# Patient Record
Sex: Male | Born: 1945 | Race: Black or African American | Hispanic: No | Smoking: Current every day smoker
Health system: Southern US, Community
[De-identification: ages and names within clinical notes are randomized; demographics above are authoritative.]

## PROBLEM LIST (undated history)

## (undated) DIAGNOSIS — K219 Gastro-esophageal reflux disease without esophagitis: Secondary | ICD-10-CM

## (undated) DIAGNOSIS — R0600 Dyspnea, unspecified: Secondary | ICD-10-CM

## (undated) DIAGNOSIS — Z973 Presence of spectacles and contact lenses: Secondary | ICD-10-CM

## (undated) DIAGNOSIS — F32A Depression, unspecified: Secondary | ICD-10-CM

## (undated) DIAGNOSIS — F17200 Nicotine dependence, unspecified, uncomplicated: Secondary | ICD-10-CM

## (undated) DIAGNOSIS — M1990 Unspecified osteoarthritis, unspecified site: Secondary | ICD-10-CM

## (undated) DIAGNOSIS — C61 Malignant neoplasm of prostate: Secondary | ICD-10-CM

## (undated) DIAGNOSIS — F419 Anxiety disorder, unspecified: Secondary | ICD-10-CM

## (undated) DIAGNOSIS — Z86010 Personal history of colonic polyps: Secondary | ICD-10-CM

## (undated) DIAGNOSIS — R350 Frequency of micturition: Secondary | ICD-10-CM

## (undated) DIAGNOSIS — I10 Essential (primary) hypertension: Secondary | ICD-10-CM

## (undated) DIAGNOSIS — F329 Major depressive disorder, single episode, unspecified: Secondary | ICD-10-CM

## (undated) HISTORY — PX: INGUINAL HERNIA REPAIR: SUR1180

## (undated) HISTORY — DX: Nicotine dependence, unspecified, uncomplicated: F17.200

## (undated) HISTORY — DX: Anxiety disorder, unspecified: F41.9

## (undated) HISTORY — PX: TONSILLECTOMY: SUR1361

## (undated) HISTORY — PX: EXCISIONAL HEMORRHOIDECTOMY: SHX1541

## (undated) HISTORY — DX: Gastro-esophageal reflux disease without esophagitis: K21.9

## (undated) HISTORY — DX: Personal history of colonic polyps: Z86.010

## (undated) HISTORY — DX: Essential (primary) hypertension: I10

## (undated) HISTORY — DX: Unspecified osteoarthritis, unspecified site: M19.90

---

## 1998-03-20 ENCOUNTER — Emergency Department (HOSPITAL_COMMUNITY): Admission: EM | Admit: 1998-03-20 | Discharge: 1998-03-20 | Payer: Self-pay | Admitting: Emergency Medicine

## 1998-03-20 ENCOUNTER — Encounter: Payer: Self-pay | Admitting: Emergency Medicine

## 1998-06-08 ENCOUNTER — Emergency Department (HOSPITAL_COMMUNITY): Admission: EM | Admit: 1998-06-08 | Discharge: 1998-06-08 | Payer: Self-pay | Admitting: Emergency Medicine

## 2001-08-31 ENCOUNTER — Encounter (HOSPITAL_BASED_OUTPATIENT_CLINIC_OR_DEPARTMENT_OTHER): Payer: Self-pay | Admitting: General Surgery

## 2001-09-05 ENCOUNTER — Ambulatory Visit (HOSPITAL_COMMUNITY): Admission: RE | Admit: 2001-09-05 | Discharge: 2001-09-05 | Payer: Self-pay | Admitting: General Surgery

## 2002-01-07 ENCOUNTER — Emergency Department (HOSPITAL_COMMUNITY): Admission: EM | Admit: 2002-01-07 | Discharge: 2002-01-07 | Payer: Self-pay | Admitting: *Deleted

## 2002-02-04 DIAGNOSIS — Z860100 Personal history of colon polyps, unspecified: Secondary | ICD-10-CM

## 2002-02-04 HISTORY — DX: Personal history of colon polyps, unspecified: Z86.0100

## 2002-02-04 HISTORY — DX: Personal history of colonic polyps: Z86.010

## 2002-07-09 ENCOUNTER — Ambulatory Visit (HOSPITAL_COMMUNITY): Admission: RE | Admit: 2002-07-09 | Discharge: 2002-07-09 | Payer: Self-pay | Admitting: General Surgery

## 2010-10-27 ENCOUNTER — Emergency Department (HOSPITAL_COMMUNITY)
Admission: EM | Admit: 2010-10-27 | Discharge: 2010-10-27 | Disposition: A | Discharge status: Home / Self Care | Payer: Medicare Other | Attending: Emergency Medicine | Admitting: Emergency Medicine

## 2010-10-27 DIAGNOSIS — I10 Essential (primary) hypertension: Secondary | ICD-10-CM | POA: Insufficient documentation

## 2010-10-27 DIAGNOSIS — R5381 Other malaise: Secondary | ICD-10-CM | POA: Insufficient documentation

## 2010-10-27 DIAGNOSIS — R6883 Chills (without fever): Secondary | ICD-10-CM | POA: Insufficient documentation

## 2010-10-27 DIAGNOSIS — K047 Periapical abscess without sinus: Secondary | ICD-10-CM | POA: Insufficient documentation

## 2010-10-27 LAB — POCT I-STAT, CHEM 8
BUN: 21 mg/dL (ref 6–23)
Calcium, Ion: 1.36 mmol/L — ABNORMAL HIGH (ref 1.12–1.32)
Chloride: 101 mEq/L (ref 96–112)
Creatinine, Ser: 1 mg/dL (ref 0.50–1.35)
Glucose, Bld: 127 mg/dL — ABNORMAL HIGH (ref 70–99)
HCT: 52 % (ref 39.0–52.0)
Hemoglobin: 17.7 g/dL — ABNORMAL HIGH (ref 13.0–17.0)
Potassium: 4.2 mEq/L (ref 3.5–5.1)
Sodium: 135 mEq/L (ref 135–145)
TCO2: 25 mmol/L (ref 0–100)

## 2010-10-27 LAB — DIFFERENTIAL
Basophils Absolute: 0 10*3/uL (ref 0.0–0.1)
Basophils Relative: 0 % (ref 0–1)
Eosinophils Absolute: 0 10*3/uL (ref 0.0–0.7)
Eosinophils Relative: 0 % (ref 0–5)
Lymphocytes Relative: 13 % (ref 12–46)
Lymphs Abs: 1.3 10*3/uL (ref 0.7–4.0)
Monocytes Absolute: 1.4 10*3/uL — ABNORMAL HIGH (ref 0.1–1.0)
Monocytes Relative: 14 % — ABNORMAL HIGH (ref 3–12)
Neutro Abs: 7 10*3/uL (ref 1.7–7.7)
Neutrophils Relative %: 72 % (ref 43–77)

## 2010-10-27 LAB — CBC
HCT: 47 % (ref 39.0–52.0)
Hemoglobin: 15.5 g/dL (ref 13.0–17.0)
MCH: 30 pg (ref 26.0–34.0)
MCHC: 33 g/dL (ref 30.0–36.0)
MCV: 90.9 fL (ref 78.0–100.0)
Platelets: 204 10*3/uL (ref 150–400)
RBC: 5.17 MIL/uL (ref 4.22–5.81)
RDW: 12.6 % (ref 11.5–15.5)
WBC: 9.6 10*3/uL (ref 4.0–10.5)

## 2010-12-14 ENCOUNTER — Encounter: Payer: Self-pay | Admitting: Gastroenterology

## 2010-12-31 ENCOUNTER — Encounter: Payer: Self-pay | Admitting: Gastroenterology

## 2011-01-04 ENCOUNTER — Ambulatory Visit: Payer: BC Managed Care – PPO | Admitting: Gastroenterology

## 2011-01-13 ENCOUNTER — Encounter: Payer: Self-pay | Admitting: *Deleted

## 2011-01-18 ENCOUNTER — Encounter: Payer: Self-pay | Admitting: Gastroenterology

## 2011-01-18 ENCOUNTER — Ambulatory Visit (INDEPENDENT_AMBULATORY_CARE_PROVIDER_SITE_OTHER): Payer: Medicare Other | Admitting: Gastroenterology

## 2011-01-18 DIAGNOSIS — R1013 Epigastric pain: Secondary | ICD-10-CM

## 2011-01-18 DIAGNOSIS — Z860100 Personal history of colon polyps, unspecified: Secondary | ICD-10-CM | POA: Insufficient documentation

## 2011-01-18 DIAGNOSIS — F32A Depression, unspecified: Secondary | ICD-10-CM | POA: Insufficient documentation

## 2011-01-18 DIAGNOSIS — K219 Gastro-esophageal reflux disease without esophagitis: Secondary | ICD-10-CM | POA: Insufficient documentation

## 2011-01-18 DIAGNOSIS — F329 Major depressive disorder, single episode, unspecified: Secondary | ICD-10-CM | POA: Insufficient documentation

## 2011-01-18 MED ORDER — PEG-KCL-NACL-NASULF-NA ASC-C 100 G PO SOLR: 1.0000 | Freq: Once | ORAL | Status: DC

## 2011-01-18 MED ORDER — ESOMEPRAZOLE MAGNESIUM 40 MG PO CPDR: 40.0000 mg | DELAYED_RELEASE_CAPSULE | Freq: Every day | ORAL | Status: DC

## 2011-01-18 NOTE — Patient Instructions (Addendum)
You have been given a separate informational sheet regarding your tobacco use, the importance of quitting and local resources to help you quit. Your procedure has been scheduled for 02/02/2011, please follow the seperate instructions.  Propofol sedation handout given.

## 2011-01-18 NOTE — Progress Notes (Signed)
History of Present Illness:  This is a very pleasant 65 year old African American male patient of Dr. Tracey Harries deferred for screening colonoscopy. The patient had a negative colonoscopy one year ago. He currently denies rectal bleeding, melena, abdominal pain, anorexia or weight loss. He does have worsening acid reflux over the last year, with both daytime and nighttime regurgitation and burning, but no dysphagia. He uses when necessary antacids with minimal improvement. He has not had previous endoscopic exam. His appetite is good and his weight is stable, and he denies any specific food intolerances. There is no history of known hepatitis, pancreatitis, or liver disease. He denies abuse of NSAIDs or alcohol but does smoke a half a pack of cigarettes per day.  I have reviewed this patient's present history, medical and surgical past history, allergies and medications.     ROS: The remainder of the 10 point ROS is negative... no cardiopulmonary complaints area and recent CBC a metabolic profile and urinalysis were normal.     Physical Exam: General well developed well nourished patient in no acute distress, appearing his stated age Eyes PERRLA, no icterus, fundoscopic exam per opthamologist Skin no lesions noted Neck supple, no adenopathy, no thyroid enlargement, no tenderness Chest clear to percussion and auscultation Heart no significant murmurs, gallops or rubs noted Abdomen no hepatosplenomegaly masses or tenderness, BS normal. . Extremities no acute joint lesions, edema, phlebitis or evidence of cellulitis. Neurologic patient oriented x 3, cranial nerves intact, no focal neurologic deficits noted. Psychological mental status normal and normal affect.  Assessment and plan: Screening colonoscopy has been scheduled at his convenience. It is of note that he does take daily Mobic, and may have a duodenal or gastric ulceration related to chronic NSAIDs use. His mild depression is well  controlled with Celexa 40 mg a day and trazodone 100 mg at bedtime. I placed him on AcipHex 20 mg a day before the evening meal pending endoscopic exam. At the time of endoscopy we will screen him for H. pylori infection. We will do his procedures with propofol sedation and nurse anesthesia attendants. Discontinue all other medications as listed and reviewed.  Encounter Diagnoses  Name Primary?  . Personal history of colonic polyps   . Esophageal reflux

## 2011-02-02 ENCOUNTER — Ambulatory Visit (AMBULATORY_SURGERY_CENTER): Payer: BC Managed Care – PPO | Admitting: Gastroenterology

## 2011-02-02 ENCOUNTER — Encounter: Payer: Self-pay | Admitting: Gastroenterology

## 2011-02-02 DIAGNOSIS — K2970 Gastritis, unspecified, without bleeding: Secondary | ICD-10-CM

## 2011-02-02 DIAGNOSIS — Z86010 Personal history of colonic polyps: Secondary | ICD-10-CM

## 2011-02-02 DIAGNOSIS — Z1211 Encounter for screening for malignant neoplasm of colon: Secondary | ICD-10-CM

## 2011-02-02 DIAGNOSIS — D128 Benign neoplasm of rectum: Secondary | ICD-10-CM

## 2011-02-02 DIAGNOSIS — D129 Benign neoplasm of anus and anal canal: Secondary | ICD-10-CM

## 2011-02-02 DIAGNOSIS — R1013 Epigastric pain: Secondary | ICD-10-CM

## 2011-02-02 DIAGNOSIS — K219 Gastro-esophageal reflux disease without esophagitis: Secondary | ICD-10-CM

## 2011-02-02 MED ORDER — SODIUM CHLORIDE 0.9 % IV SOLN: 500.0000 mL | INTRAVENOUS | Status: DC

## 2011-02-02 NOTE — Op Note (Signed)
[facility] [address] [place], [place]  [address]  COLONOSCOPY PROCEDURE REPORT  PATIENT:  Brady Rodriguez, Brady Rodriguez  MR#:  [id number] BIRTHDATE:  1945-05-09, 65 yrs. old  GENDER:  male ENDOSCOPIST:  Vania Rea. Jarold Motto, MD, [facility] REF. BY:  Tracey Harries, M.D. PROCEDURE DATE:  02/02/2011 PROCEDURE:  Colonoscopy with biopsy ASA CLASS:  Class II INDICATIONS:  history of polyps MEDICATIONS:   propofol (Diprivan) 200 mg IV  DESCRIPTION OF PROCEDURE:   After the risks and benefits and of the procedure were explained, informed consent was obtained. Digital rectal exam was performed and revealed no abnormalities. The LB CF-H180AL [id number] endoscope was introduced through the anus and advanced to the cecum, which was identified by both the appendix and ileocecal valve.  The quality of the prep was excellent, using MoviPrep.  The instrument was then slowly withdrawn as the colon was fully examined. <<PROCEDUREIMAGES>>  FINDINGS:  There were multiple polyps identified and removed. in the rectum. small 1-3 mm rectal nodules biopsied.  This was otherwise a normal examination of the colon.   Retroflexed views in the rectum revealed no abnormalities.    The scope was then withdrawn from the patient and the procedure completed.  COMPLICATIONS:  None ENDOSCOPIC IMPRESSION: 1) Polyps, multiple in the rectum 2) Otherwise normal examination R/O ADENOMAS. RECOMMENDATIONS: 1) Repeat colonoscopy in 5 years if polyp adenomatous; otherwise 10 years  REPEAT EXAM:  No  ______________________________ Vania Rea. Jarold Motto, MD, [facility]  CC:  n. eSIGNED:   Vania Rea. Talal Fritchman at 02/02/2011 02:31 PM  Orlean Bradford, [phone]

## 2011-02-02 NOTE — Progress Notes (Signed)
Patient did not experience any of the following events: a burn prior to discharge; a fall within the facility; wrong site/side/patient/procedure/implant event; or a hospital transfer or hospital admission upon discharge from the facility. [phone]) Patient did not have preoperative order for IV antibiotic SSI prophylaxis. [phone])   No complaints noted in the recovery room. maw

## 2011-02-02 NOTE — Patient Instructions (Signed)
See the picture page for your findings from your exam today.  Follow the green and blue discharge instruction sheets the rest of the day.  Resume your prior medications today. Please call if any questions or concerns.  

## 2011-02-03 ENCOUNTER — Telehealth: Payer: Self-pay

## 2011-02-03 DIAGNOSIS — K2970 Gastritis, unspecified, without bleeding: Secondary | ICD-10-CM

## 2011-02-03 DIAGNOSIS — K2990 Gastroduodenitis, unspecified, without bleeding: Secondary | ICD-10-CM

## 2011-02-03 LAB — HELICOBACTER PYLORI SCREEN-BIOPSY: UREASE: NEGATIVE

## 2011-02-03 NOTE — Op Note (Addendum)
[facility] [address] [place], [place]  [address]  ENDOSCOPY PROCEDURE REPORT  PATIENT:  Brady, Rodriguez  MR#:  [id number] BIRTHDATE:  1945-09-11, 65 yrs. old  GENDER:  male  ENDOSCOPIST:  Vania Rea. Jarold Motto, MD, [facility] Referred by:  Tracey Harries, M.D.  PROCEDURE DATE:  02/02/2011 PROCEDURE:  EGD with biopsy for H. pylori 19147 ASA CLASS:  Class II INDICATIONS:  GERD HX OF PUD  MEDICATIONS:   There was residual sedation effect present from prior procedure., propofol (Diprivan) 130 mg IV TOPICAL ANESTHETIC:  DESCRIPTION OF PROCEDURE:   After the risks and benefits of the procedure were explained, informed consent was obtained.  The LB GIF-H180 [id number] endoscope was introduced through the mouth and advanced to the second portion of the duodenum.  The instrument was slowly withdrawn as the mucosa was fully examined. <<PROCEDUREIMAGES>>  A hiatal hernia was found. 5 CM HIATIAL HERNIA NOTED,NO BARRETT'S OR STRICTURE.  Otherwise the examination was normal. CLO BX. DONE. other findings. "INLET POUCH" IN PROXIMAL ESOPHAGITIS. Retroflexed views revealed a hiatal hernia.    The scope was then withdrawn from the patient and the procedure completed.  COMPLICATIONS:  None  ENDOSCOPIC IMPRESSION: 1) Hiatal hernia 2) Otherwise normal examination CHRONIC NONEROSIVE GERD ON PPI RX. RECOMMENDATIONS: 1) Anti-reflux regimen to be follow 2) Await pathology results 3) Rx CLO if positive 4) continue PPI  ______________________________ Vania Rea. Jarold Motto, MD, [facility]  CC:  n. REVISED:  02/02/2011 03:18 PM eSIGNED:   Vania Rea. Patterson at 02/02/2011 03:18 PM  Orlean Bradford, [phone]

## 2011-02-03 NOTE — Telephone Encounter (Signed)

## 2011-02-04 ENCOUNTER — Encounter: Payer: Self-pay | Admitting: Gastroenterology

## 2011-02-15 HISTORY — PX: COLONOSCOPY W/ BIOPSIES AND POLYPECTOMY: SHX1376

## 2011-02-16 ENCOUNTER — Encounter: Payer: Self-pay | Admitting: Gastroenterology

## 2011-02-17 ENCOUNTER — Encounter: Payer: Self-pay | Admitting: Gastroenterology

## 2013-05-10 DIAGNOSIS — I10 Essential (primary) hypertension: Secondary | ICD-10-CM | POA: Insufficient documentation

## 2013-05-10 DIAGNOSIS — F17200 Nicotine dependence, unspecified, uncomplicated: Secondary | ICD-10-CM | POA: Insufficient documentation

## 2013-11-29 ENCOUNTER — Other Ambulatory Visit (INDEPENDENT_AMBULATORY_CARE_PROVIDER_SITE_OTHER): Payer: Self-pay | Admitting: Surgery

## 2013-12-27 ENCOUNTER — Encounter (HOSPITAL_BASED_OUTPATIENT_CLINIC_OR_DEPARTMENT_OTHER): Payer: Self-pay | Admitting: *Deleted

## 2013-12-27 NOTE — Progress Notes (Signed)
To come in for [id number]-bmet Son to come with him for surgery

## 2013-12-30 ENCOUNTER — Encounter (HOSPITAL_BASED_OUTPATIENT_CLINIC_OR_DEPARTMENT_OTHER)
Admission: RE | Admit: 2013-12-30 | Discharge: 2013-12-30 | Disposition: A | Discharge status: Home / Self Care | Payer: Medicare Other | Source: Ambulatory Visit | Attending: Surgery | Admitting: Surgery

## 2013-12-30 ENCOUNTER — Other Ambulatory Visit: Payer: Self-pay

## 2013-12-30 DIAGNOSIS — I10 Essential (primary) hypertension: Secondary | ICD-10-CM | POA: Diagnosis not present

## 2013-12-30 DIAGNOSIS — R229 Localized swelling, mass and lump, unspecified: Secondary | ICD-10-CM | POA: Diagnosis present

## 2013-12-30 DIAGNOSIS — F17210 Nicotine dependence, cigarettes, uncomplicated: Secondary | ICD-10-CM | POA: Diagnosis not present

## 2013-12-30 DIAGNOSIS — D1779 Benign lipomatous neoplasm of other sites: Secondary | ICD-10-CM | POA: Diagnosis not present

## 2013-12-30 DIAGNOSIS — K219 Gastro-esophageal reflux disease without esophagitis: Secondary | ICD-10-CM | POA: Diagnosis not present

## 2013-12-30 LAB — BASIC METABOLIC PANEL
ANION GAP: 8 (ref 5–15)
BUN: 17 mg/dL (ref 6–23)
CALCIUM: 11.3 mg/dL — AB (ref 8.4–10.5)
CO2: 29 mEq/L (ref 19–32)
Chloride: 104 mEq/L (ref 96–112)
Creatinine, Ser: 1.03 mg/dL (ref 0.50–1.35)
GFR calc Af Amer: 84 mL/min — ABNORMAL LOW (ref >90)
GFR calc non Af Amer: 72 mL/min — ABNORMAL LOW (ref >90)
Glucose, Bld: 124 mg/dL — ABNORMAL HIGH (ref 70–99)
Potassium: 4.3 mEq/L (ref 3.7–5.3)
Sodium: 141 mEq/L (ref 137–147)

## 2013-12-30 NOTE — H&P (Signed)
  Brady Rodriguez  Location: [facility] Surgery Patient #: [id number] DOB: [birth year] Divorced / Language: Undefined / Race: Undefined Male  History of Present Illness  Patient words: eval scalp.  The patient is a 68 year old male who presents with a complaint of Mass. 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 He presents with a mass on his anterior scalp. He reports that it has been present for several years it is slowly getting larger and causing some mild discomfort. He has never had drainage from the area. He is otherwise without complaints   Other Problems  Gastroesophageal Reflux Disease  Past Surgical History  Colon Polyp Removal - Open Foot Surgery Bilateral.  Diagnostic Studies History ( Colonoscopy 1-5 years ago  Allergies  No Known Drug Allergies10/16/2015  Medication History  Cialis (5MG  Tablet, Oral as needed) Active. Atenolol (50MG  Tablet, Oral daily) Active. Omeprazole (20MG  Capsule DR, Oral daily) Active. TraZODone HCl (100MG  Tablet, Oral daily) Active.  Social History  Alcohol use Occasional alcohol use. Tobacco use Current every day smoker.  Family History Heart disease in male family member before age 93  Review of Systems  General Present- Night Sweats. Not Present- Appetite Loss, Chills, Fatigue, Fever, Weight Gain and Weight Loss. Skin Present- Dryness. Not Present- Change in Wart/Mole, Hives, Jaundice, New Lesions, Non-Healing Wounds, Rash and Ulcer. HEENT Present- Seasonal Allergies and Wears glasses/contact lenses. Not Present- Earache, Hearing Loss, Hoarseness, Nose Bleed, Oral Ulcers, Ringing in the Ears, Sinus Pain, Sore Throat, Visual Disturbances and Yellow Eyes. Respiratory Not Present- Bloody sputum, Chronic Cough, Difficulty Breathing, Snoring and Wheezing. Gastrointestinal Not Present- Abdominal Pain, Bloating, Bloody Stool, Change in Bowel Habits, Chronic diarrhea, Constipation, Difficulty Swallowing, Excessive gas, Gets full  quickly at meals, Hemorrhoids, Indigestion, Nausea, Rectal Pain and Vomiting. Male Genitourinary Present- Nocturia. Not Present- Blood in Urine, Change in Urinary Stream, Frequency, Impotence, Painful Urination, Urgency and Urine Leakage. Neurological Present- Headaches and Numbness. Not Present- Decreased Memory, Fainting, Seizures, Tingling, Tremor, Trouble walking and Weakness. Psychiatric Present- Anxiety and Depression. Not Present- Bipolar, Change in Sleep Pattern, Fearful and Frequent crying. Endocrine Not Present- Cold Intolerance, Excessive Hunger, Hair Changes, Heat Intolerance, Hot flashes and New Diabetes. Hematology Not Present- Easy Bruising, Excessive bleeding, Gland problems, HIV and Persistent Infections.   Vitals  11/29/2013 10:54 AM Weight: 165 lb Temp.: 16F(Temporal)  Pulse: 75 (Regular)  BP: 130/70 (Sitting, Left Arm, Standard)    Physical Exam  The physical exam findings are as follows: Note:On exam, he is well in appearance  There is a 2 cm scalp mass which is slightly superficial and soft. It is just off to the midline. 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 This may either represent a sebaceous cyst or lipoma. Nonetheless, it is symptomatic and needs to be removed for histologic evaluation. I discussed this with him in detail. I discussed the risks which include but not limited to bleeding, infection, and recurrence. He understands and wishes to proceed

## 2013-12-31 ENCOUNTER — Ambulatory Visit (HOSPITAL_BASED_OUTPATIENT_CLINIC_OR_DEPARTMENT_OTHER): Payer: Medicare Other | Admitting: Anesthesiology

## 2013-12-31 ENCOUNTER — Ambulatory Visit (HOSPITAL_BASED_OUTPATIENT_CLINIC_OR_DEPARTMENT_OTHER)
Admission: RE | Admit: 2013-12-31 | Discharge: 2013-12-31 | Disposition: A | Discharge status: Home / Self Care | Payer: Medicare Other | Source: Ambulatory Visit | Attending: Surgery | Admitting: Surgery

## 2013-12-31 ENCOUNTER — Encounter (HOSPITAL_BASED_OUTPATIENT_CLINIC_OR_DEPARTMENT_OTHER)
Admission: RE | Disposition: A | Discharge status: Home / Self Care | Payer: Self-pay | Source: Ambulatory Visit | Attending: Surgery

## 2013-12-31 ENCOUNTER — Encounter (HOSPITAL_BASED_OUTPATIENT_CLINIC_OR_DEPARTMENT_OTHER): Payer: Self-pay | Admitting: *Deleted

## 2013-12-31 DIAGNOSIS — D1779 Benign lipomatous neoplasm of other sites: Secondary | ICD-10-CM | POA: Insufficient documentation

## 2013-12-31 DIAGNOSIS — I10 Essential (primary) hypertension: Secondary | ICD-10-CM | POA: Diagnosis not present

## 2013-12-31 DIAGNOSIS — F17210 Nicotine dependence, cigarettes, uncomplicated: Secondary | ICD-10-CM | POA: Diagnosis not present

## 2013-12-31 DIAGNOSIS — K219 Gastro-esophageal reflux disease without esophagitis: Secondary | ICD-10-CM | POA: Insufficient documentation

## 2013-12-31 HISTORY — PX: MASS EXCISION: SHX2000

## 2013-12-31 HISTORY — DX: Depression, unspecified: F32.A

## 2013-12-31 HISTORY — DX: Major depressive disorder, single episode, unspecified: F32.9

## 2013-12-31 LAB — POCT HEMOGLOBIN-HEMACUE: Hemoglobin: 14.6 g/dL (ref 13.0–17.0)

## 2013-12-31 SURGERY — EXCISION MASS
Anesthesia: General

## 2013-12-31 MED ORDER — MIDAZOLAM HCL 2 MG/2ML IJ SOLN: 1.0000 mg | INTRAMUSCULAR | Status: DC | PRN

## 2013-12-31 MED ORDER — ONDANSETRON HCL 4 MG/2ML IJ SOLN
INTRAMUSCULAR | Status: DC | PRN
Start: 2013-12-31 — End: 2013-12-31
  Administered 2013-12-31: 4 mg via INTRAVENOUS

## 2013-12-31 MED ORDER — OXYCODONE HCL 5 MG PO TABS: 5.0000 mg | ORAL_TABLET | Freq: Once | ORAL | Status: DC | PRN

## 2013-12-31 MED ORDER — BACITRACIN 500 UNIT/GM EX OINT
TOPICAL_OINTMENT | CUTANEOUS | Status: DC | PRN
  Administered 2013-12-31: 1 via TOPICAL

## 2013-12-31 MED ORDER — PROPOFOL 10 MG/ML IV BOLUS
INTRAVENOUS | Status: DC | PRN
  Administered 2013-12-31: 200 mg via INTRAVENOUS

## 2013-12-31 MED ORDER — CEFAZOLIN SODIUM-DEXTROSE 2-3 GM-% IV SOLR: 2.0000 g | INTRAVENOUS | Status: DC

## 2013-12-31 MED ORDER — LIDOCAINE-EPINEPHRINE (PF) 1 %-1:200000 IJ SOLN
INTRAMUSCULAR | Status: DC | PRN
  Administered 2013-12-31: 6.5 mL

## 2013-12-31 MED ORDER — FENTANYL CITRATE 0.05 MG/ML IJ SOLN: 50.0000 ug | INTRAMUSCULAR | Status: DC | PRN

## 2013-12-31 MED ORDER — MIDAZOLAM HCL 5 MG/5ML IJ SOLN
INTRAMUSCULAR | Status: DC | PRN
  Administered 2013-12-31: 2 mg via INTRAVENOUS

## 2013-12-31 MED ORDER — LIDOCAINE-EPINEPHRINE (PF) 1 %-1:200000 IJ SOLN
INTRAMUSCULAR | Status: AC
  Filled 2013-12-31: qty 10

## 2013-12-31 MED ORDER — HYDROMORPHONE HCL 1 MG/ML IJ SOLN: 0.2500 mg | INTRAMUSCULAR | Status: DC | PRN

## 2013-12-31 MED ORDER — FENTANYL CITRATE 0.05 MG/ML IJ SOLN
INTRAMUSCULAR | Status: DC | PRN
  Administered 2013-12-31 (×2): 50 ug via INTRAVENOUS

## 2013-12-31 MED ORDER — CEFAZOLIN SODIUM-DEXTROSE 2-3 GM-% IV SOLR
INTRAVENOUS | Status: AC
  Filled 2013-12-31: qty 50

## 2013-12-31 MED ORDER — FENTANYL CITRATE 0.05 MG/ML IJ SOLN
INTRAMUSCULAR | Status: AC
  Filled 2013-12-31: qty 6

## 2013-12-31 MED ORDER — DEXAMETHASONE SODIUM PHOSPHATE 4 MG/ML IJ SOLN
INTRAMUSCULAR | Status: DC | PRN
  Administered 2013-12-31: 10 mg via INTRAVENOUS

## 2013-12-31 MED ORDER — ONDANSETRON HCL 4 MG/2ML IJ SOLN: 4.0000 mg | Freq: Once | INTRAMUSCULAR | Status: DC | PRN

## 2013-12-31 MED ORDER — OXYCODONE HCL 5 MG/5ML PO SOLN: 5.0000 mg | Freq: Once | ORAL | Status: DC | PRN

## 2013-12-31 MED ORDER — MIDAZOLAM HCL 2 MG/2ML IJ SOLN
INTRAMUSCULAR | Status: AC
  Filled 2013-12-31: qty 2

## 2013-12-31 MED ORDER — CEFAZOLIN SODIUM-DEXTROSE 2-3 GM-% IV SOLR
INTRAVENOUS | Status: DC | PRN
  Administered 2013-12-31: 2 g via INTRAVENOUS

## 2013-12-31 MED ORDER — LIDOCAINE HCL (CARDIAC) 20 MG/ML IV SOLN
INTRAVENOUS | Status: DC | PRN
  Administered 2013-12-31: 30 mg via INTRAVENOUS

## 2013-12-31 MED ORDER — OXYCODONE-ACETAMINOPHEN 5-325 MG PO TABS: 1.0000 | ORAL_TABLET | ORAL | Status: DC | PRN

## 2013-12-31 MED ORDER — LACTATED RINGERS IV SOLN
INTRAVENOUS | Status: DC
  Administered 2013-12-31: 09:00:00 via INTRAVENOUS

## 2013-12-31 SURGICAL SUPPLY — 49 items
APL SKNCLS STERI-STRIP NONHPOA (GAUZE/BANDAGES/DRESSINGS) ×1
BENZOIN TINCTURE PRP APPL 2/3 (GAUZE/BANDAGES/DRESSINGS) ×3 IMPLANT
BLADE CLIPPER SURG (BLADE) IMPLANT
BLADE HEX COATED 2.75 (ELECTRODE) ×3 IMPLANT
BLADE SURG 15 STRL LF DISP TIS (BLADE) ×1 IMPLANT
BLADE SURG 15 STRL SS (BLADE) ×3
CANISTER SUCT 1200ML W/VALVE (MISCELLANEOUS) IMPLANT
CANISTER SUCTION 1200CC (MISCELLANEOUS) ×2 IMPLANT
CHLORAPREP W/TINT 26ML (MISCELLANEOUS) ×3 IMPLANT
CLOSURE WOUND 1/2 X4 (GAUZE/BANDAGES/DRESSINGS) ×1
COVER BACK TABLE 60X90IN (DRAPES) ×3 IMPLANT
COVER MAYO STAND STRL (DRAPES) ×3 IMPLANT
DECANTER SPIKE VIAL GLASS SM (MISCELLANEOUS) IMPLANT
DRAPE PED LAPAROTOMY (DRAPES) ×3 IMPLANT
DRAPE UTILITY XL STRL (DRAPES) ×3 IMPLANT
DRSG TEGADERM 4X4.75 (GAUZE/BANDAGES/DRESSINGS) ×3 IMPLANT
ELECT REM PT RETURN 9FT ADLT (ELECTROSURGICAL) ×3
ELECTRODE REM PT RTRN 9FT ADLT (ELECTROSURGICAL) ×1 IMPLANT
GLOVE BIOGEL PI IND STRL 7.0 (GLOVE) IMPLANT
GLOVE BIOGEL PI INDICATOR 7.0 (GLOVE) ×2
GLOVE ECLIPSE 6.5 STRL STRAW (GLOVE) ×2 IMPLANT
GLOVE SURG SIGNA 7.5 PF LTX (GLOVE) ×3 IMPLANT
GOWN STRL REUS W/ TWL LRG LVL3 (GOWN DISPOSABLE) ×1 IMPLANT
GOWN STRL REUS W/ TWL XL LVL3 (GOWN DISPOSABLE) ×1 IMPLANT
GOWN STRL REUS W/TWL LRG LVL3 (GOWN DISPOSABLE) ×3
GOWN STRL REUS W/TWL XL LVL3 (GOWN DISPOSABLE) ×3
NDL HYPO 25X1 1.5 SAFETY (NEEDLE) ×1 IMPLANT
NEEDLE HYPO 25X1 1.5 SAFETY (NEEDLE) ×3 IMPLANT
NS IRRIG 1000ML POUR BTL (IV SOLUTION) IMPLANT
PACK BASIN DAY SURGERY FS (CUSTOM PROCEDURE TRAY) ×3 IMPLANT
PENCIL BUTTON HOLSTER BLD 10FT (ELECTRODE) ×3 IMPLANT
SLEEVE SCD COMPRESS KNEE MED (MISCELLANEOUS) IMPLANT
SPONGE GAUZE 4X4 12PLY STER LF (GAUZE/BANDAGES/DRESSINGS) ×3 IMPLANT
SPONGE LAP 4X18 X RAY DECT (DISPOSABLE) ×3 IMPLANT
STRIP CLOSURE SKIN 1/2X4 (GAUZE/BANDAGES/DRESSINGS) ×2 IMPLANT
SUT MNCRL AB 4-0 PS2 18 (SUTURE) IMPLANT
SUT PROLENE 3 0 PS 2 (SUTURE) ×2 IMPLANT
SUT VIC AB 2-0 SH 27 (SUTURE)
SUT VIC AB 2-0 SH 27XBRD (SUTURE) IMPLANT
SUT VIC AB 3-0 SH 27 (SUTURE)
SUT VIC AB 3-0 SH 27X BRD (SUTURE) IMPLANT
SYR BULB 3OZ (MISCELLANEOUS) IMPLANT
SYR CONTROL 10ML LL (SYRINGE) ×3 IMPLANT
TOWEL OR 17X24 6PK STRL BLUE (TOWEL DISPOSABLE) ×3 IMPLANT
TOWEL OR NON WOVEN STRL DISP B (DISPOSABLE) ×3 IMPLANT
TRAY DSU PREP LF (CUSTOM PROCEDURE TRAY) IMPLANT
TUBE CONNECTING 20'X1/4 (TUBING) ×1
TUBE CONNECTING 20X1/4 (TUBING) ×1 IMPLANT
YANKAUER SUCT BULB TIP NO VENT (SUCTIONS) ×2 IMPLANT

## 2013-12-31 NOTE — Discharge Instructions (Signed)
Ok to shower starting tomorrow   Post Anesthesia Home Care Instructions  Activity: Get plenty of rest for the remainder of the day. A responsible adult should stay with you for 24 hours following the procedure.  For the next 24 hours, DO NOT: -Drive a car -Paediatric nurse -Drink alcoholic beverages -Take any medication unless instructed by your physician -Make any legal decisions or sign important papers.  Meals: Start with liquid foods such as gelatin or soup. Progress to regular foods as tolerated. Avoid greasy, spicy, heavy foods. If nausea and/or vomiting occur, drink only clear liquids until the nausea and/or vomiting subsides. Call your physician if vomiting continues.  Special Instructions/Symptoms: Your throat may feel dry or sore from the anesthesia or the breathing tube placed in your throat during surgery. If this causes discomfort, gargle with warm salt water. The discomfort should disappear within 24 hours.  Call your surgeon if you experience:   1.  Fever over 101.0. 2.  Inability to urinate. 3.  Nausea and/or vomiting. 4.  Extreme swelling or bruising at the surgical site. 5.  Continued bleeding from the incision. 6.  Increased pain, redness or drainage from the incision. 7.  Problems related to your pain medication. 8. Any change in color, movement and/or sensation 9. Any problems and/or concerns

## 2013-12-31 NOTE — Anesthesia Preprocedure Evaluation (Signed)
Anesthesia Evaluation  Patient identified by MRN, date of birth, ID band Patient awake    Reviewed: Allergy & Precautions, H&P , NPO status , Patient's Chart, lab work & pertinent test results, reviewed documented beta blocker date and time   Airway Mallampati: I  TM Distance: >3 FB Neck ROM: Full    Dental  (+) Teeth Intact, Dental Advisory Given   Pulmonary Current Smoker,  breath sounds clear to auscultation        Cardiovascular hypertension, Pt. on medications and Pt. on home beta blockers Rhythm:Regular Rate:Normal     Neuro/Psych    GI/Hepatic GERD-  Controlled and Medicated,  Endo/Other    Renal/GU      Musculoskeletal   Abdominal   Peds  Hematology   Anesthesia Other Findings   Reproductive/Obstetrics                             Anesthesia Physical Anesthesia Plan  ASA: II  Anesthesia Plan: General   Post-op Pain Management:    Induction: Intravenous  Airway Management Planned: LMA  Additional Equipment:   Intra-op Plan:   Post-operative Plan: Extubation in OR  Informed Consent: I have reviewed the patients History and Physical, chart, labs and discussed the procedure including the risks, benefits and alternatives for the proposed anesthesia with the patient or authorized representative who has indicated his/her understanding and acceptance.   Dental advisory given  Plan Discussed with: CRNA, Anesthesiologist and Surgeon  Anesthesia Plan Comments:         Anesthesia Quick Evaluation

## 2013-12-31 NOTE — Interval H&P Note (Signed)
History and Physical Interval Note:no change in H and P  12/31/2013 8:20 AM  Brady Rodriguez  has presented today for surgery, with the diagnosis of Scalp Mass  The various methods of treatment have been discussed with the patient and family. After consideration of risks, benefits and other options for treatment, the patient has consented to  Procedure(s): EXCISION OF SCALP MASS (N/A) as a surgical intervention .  The patient's history has been reviewed, patient examined, no change in status, stable for surgery.  I have reviewed the patient's chart and labs.  Questions were answered to the patient's satisfaction.     [facility] A

## 2013-12-31 NOTE — Anesthesia Procedure Notes (Signed)
Procedure Name: LMA Insertion Date/Time: 12/31/2013 9:44 AM Performed by: Toula Moos L Pre-anesthesia Checklist: Patient identified, Emergency Drugs available, Suction available, Patient being monitored and Timeout performed Patient Re-evaluated:Patient Re-evaluated prior to inductionOxygen Delivery Method: Circle System Utilized Preoxygenation: Pre-oxygenation with 100% oxygen Intubation Type: IV induction Ventilation: Mask ventilation without difficulty LMA: LMA inserted LMA Size: 5.0 Number of attempts: 1 Airway Equipment and Method: bite block Placement Confirmation: positive ETCO2 Tube secured with: Tape Dental Injury: Teeth and Oropharynx as per pre-operative assessment

## 2013-12-31 NOTE — Anesthesia Postprocedure Evaluation (Signed)
  Anesthesia Post-op Note  Patient: Brady Rodriguez  Procedure(s) Performed: Procedure(s): EXCISION OF SCALP MASS (N/A)  Patient Location: PACU  Anesthesia Type: General   Level of Consciousness: awake, alert  and oriented  Airway and Oxygen Therapy: Patient Spontanous Breathing  Post-op Pain: mild  Post-op Assessment: Post-op Vital signs reviewed  Post-op Vital Signs: Reviewed  Last Vitals:  Filed Vitals:   12/31/13 1111  BP: 179/98  Pulse: 72  Temp: 36.5 C  Resp: 18    Complications: No apparent anesthesia complications

## 2013-12-31 NOTE — Transfer of Care (Signed)
Immediate Anesthesia Transfer of Care Note  Patient: Brady Rodriguez  Procedure(s) Performed: Procedure(s): EXCISION OF SCALP MASS (N/A)  Patient Location: PACU  Anesthesia Type:General  Level of Consciousness: awake and patient cooperative  Airway & Oxygen Therapy: Patient Spontanous Breathing and Patient connected to face mask oxygen  Post-op Assessment: Report given to PACU RN and Post -op Vital signs reviewed and stable  Post vital signs: Reviewed and stable  Complications: No apparent anesthesia complications

## 2013-12-31 NOTE — Op Note (Signed)
EXCISION OF SCALP MASS  Procedure Note  Brady Rodriguez 12/31/2013   Pre-op Diagnosis: Scalp Mass     Post-op Diagnosis: same  Procedure(s): EXCISION OF SCALP MASS (2 cm)  Surgeon(s): Coralie Keens, MD  Anesthesia: General  Staff:  Circulator: Maurene Capes, RN Scrub Person: Levora Angel, RN Circulator Assistant: Eston Esters, RN  Estimated Blood Loss: Minimal               Specimens: sent to path          [facility] A   Date: 12/31/2013  Time: 10:05 AM

## 2014-01-01 ENCOUNTER — Encounter (HOSPITAL_BASED_OUTPATIENT_CLINIC_OR_DEPARTMENT_OTHER): Payer: Self-pay | Admitting: Surgery

## 2014-01-01 NOTE — Op Note (Signed)
NAMEPieter Partridge NO.:  [id number]  MEDICAL RECORD NO.:  [id number]  LOCATION:                                 FACILITY:  PHYSICIAN:  Coralie Keens, M.D. DATE OF BIRTH:  [birth year]  DATE OF PROCEDURE:  12/31/2013 DATE OF DISCHARGE:  12/31/2013                              OPERATIVE REPORT   PREOPERATIVE DIAGNOSIS:  Scalp mass.  POSTOPERATIVE DIAGNOSIS:  Scalp mass.  PROCEDURE:  Excision of 2-cm scalp mass.  SURGEON:  Coralie Keens, M.D.  ANESTHESIA:  General and 1% lidocaine with epinephrine.  ESTIMATED BLOOD LOSS:  Minimal.  FINDINGS:  The patient was found to have a mass consistent with lipoma on the top of the scalp that was sent to Pathology for evaluation.  PROCEDURE IN DETAIL:  The patient was brought to the operating room, identified as Brady Rodriguez.  He was placed supine on the operating room table and anesthesia was induced.  The scalp was then prepped and draped in usual sterile fashion.  I anesthetized the skin over the palpable mass with lidocaine.  I made an elliptical incision with a scalpel, took this down through the subcutaneous tissue with electrocautery.  I then identified the mass which appeared consistent with a lipoma and was able to excise it in its entirety with electrocautery.  I then achieved hemostasis with cautery.  I anesthetized the wound further with Marcaine.  I then closed the incision with interrupted 3-0 Prolene sutures.  The patient tolerated the procedure well.  All the counts were correct at the end of procedure.  The patient was then extubated in operating room and taken in stable condition to recovery room.     Coralie Keens, M.D.     DB/MEDQ  D:  12/31/2013  T:  12/31/2013  Job:  [id number]

## 2014-12-18 ENCOUNTER — Other Ambulatory Visit: Payer: Self-pay | Admitting: Surgery

## 2015-10-23 ENCOUNTER — Other Ambulatory Visit: Payer: Self-pay | Admitting: Surgery

## 2015-11-27 ENCOUNTER — Other Ambulatory Visit: Payer: Self-pay | Admitting: Surgery

## 2015-12-22 NOTE — Pre-Procedure Instructions (Signed)
    Brady Rodriguez  12/22/2015     No Pharmacies Listed   Your procedure is scheduled on Monday, November 13th, 2017.  Report to [facility] Admitting at 8:30 A.M.   Call this number if you have problems the morning of surgery:  [phone]   Remember:  Do not eat food or drink liquids after midnight.   Take these medicines the morning of surgery with A SIP OF WATER: Amlodipine (Norvasc), Omeprazole (Prilosec), Paroxetine (Paxil).  Stop taking: Aspirin, NSAIDS, Aleve, Naproxen, Ibuprofen, Advil, Motrin, BC's, Goody's, Fish oil, all herbal medications, and all vitamins.    Do not wear jewelry.  Do not wear lotions, powders, or colognes, or deoderant.  Men may shave face and neck.  Do not bring valuables to the hospital.  [facility] is not responsible for any belongings or valuables.  Contacts, dentures or bridgework may not be worn into surgery.  Leave your suitcase in the car.  After surgery it may be brought to your room.  For patients admitted to the hospital, discharge time will be determined by your treatment team.  Patients discharged the day of surgery will not be allowed to drive home.   Special instructions:  Preparing for Surgery.   [place]- Preparing For Surgery  Before surgery, you can play an important role. Because skin is not sterile, your skin needs to be as free of germs as possible. You can reduce the number of germs on your skin by washing with CHG (chlorahexidine gluconate) Soap before surgery.  CHG is an antiseptic cleaner which kills germs and bonds with the skin to continue killing germs even after washing.  Please do not use if you have an allergy to CHG or antibacterial soaps. If your skin becomes reddened/irritated stop using the CHG.  Do not shave (including legs and underarms) for at least 48 hours prior to first CHG shower. It is OK to shave your face.  Please follow these instructions carefully.   1. Shower the NIGHT BEFORE  SURGERY and the MORNING OF SURGERY with CHG.   2. If you chose to wash your hair, wash your hair first as usual with your normal shampoo.  3. After you shampoo, rinse your hair and body thoroughly to remove the shampoo.  4. Use CHG as you would any other liquid soap. You can apply CHG directly to the skin and wash gently with a scrungie or a clean washcloth.   5. Apply the CHG Soap to your body ONLY FROM THE NECK DOWN.  Do not use on open wounds or open sores. Avoid contact with your eyes, ears, mouth and genitals (private parts). Wash genitals (private parts) with your normal soap.  6. Wash thoroughly, paying special attention to the area where your surgery will be performed.  7. Thoroughly rinse your body with warm water from the neck down.  8. DO NOT shower/wash with your normal soap after using and rinsing off the CHG Soap.  9. Pat yourself dry with a CLEAN TOWEL.   10. Wear CLEAN PAJAMAS   11. Place CLEAN SHEETS on your bed the night of your first shower and DO NOT SLEEP WITH PETS.  Day of Surgery: Do not apply any deodorants/lotions. Please wear clean clothes to the hospital/surgery center.     Please read over the following fact sheets that you were given.

## 2015-12-23 ENCOUNTER — Encounter (HOSPITAL_COMMUNITY)
Admission: RE | Admit: 2015-12-23 | Discharge: 2015-12-23 | Disposition: A | Discharge status: Home / Self Care | Payer: Medicare Other | Source: Ambulatory Visit | Attending: Surgery | Admitting: Surgery

## 2015-12-23 ENCOUNTER — Encounter (HOSPITAL_COMMUNITY): Payer: Self-pay

## 2015-12-23 DIAGNOSIS — Z01812 Encounter for preprocedural laboratory examination: Secondary | ICD-10-CM | POA: Insufficient documentation

## 2015-12-23 DIAGNOSIS — Z01810 Encounter for preprocedural cardiovascular examination: Secondary | ICD-10-CM | POA: Diagnosis not present

## 2015-12-23 DIAGNOSIS — I10 Essential (primary) hypertension: Secondary | ICD-10-CM | POA: Diagnosis present

## 2015-12-23 HISTORY — DX: Frequency of micturition: R35.0

## 2015-12-23 HISTORY — DX: Presence of spectacles and contact lenses: Z97.3

## 2015-12-23 HISTORY — DX: Dyspnea, unspecified: R06.00

## 2015-12-23 LAB — CBC
HEMATOCRIT: 42.9 % (ref 39.0–52.0)
Hemoglobin: 14.1 g/dL (ref 13.0–17.0)
MCH: 30.7 pg (ref 26.0–34.0)
MCHC: 32.9 g/dL (ref 30.0–36.0)
MCV: 93.3 fL (ref 78.0–100.0)
Platelets: 221 10*3/uL (ref 150–400)
RBC: 4.6 MIL/uL (ref 4.22–5.81)
RDW: 13.4 % (ref 11.5–15.5)
WBC: 9.8 10*3/uL (ref 4.0–10.5)

## 2015-12-23 LAB — BASIC METABOLIC PANEL
Anion gap: 6 (ref 5–15)
BUN: 12 mg/dL (ref 6–20)
CHLORIDE: 110 mmol/L (ref 101–111)
CO2: 24 mmol/L (ref 22–32)
Calcium: 12.6 mg/dL — ABNORMAL HIGH (ref 8.9–10.3)
Creatinine, Ser: 0.87 mg/dL (ref 0.61–1.24)
GFR calc Af Amer: >60 mL/min (ref >60)
GFR calc non Af Amer: >60 mL/min (ref >60)
GLUCOSE: 88 mg/dL (ref 65–99)
POTASSIUM: 4.3 mmol/L (ref 3.5–5.1)
Sodium: 140 mmol/L (ref 135–145)

## 2015-12-23 NOTE — Progress Notes (Signed)
Anesthesia PAT Evaluation: Patient is a 70 year old male scheduled for parathyroidectomy on 12/28/15 by Dr. Coralie Keens. DX: parathyroid adenoma.   History includes smoking, HTN, anxiety, depression, exertional dyspnea, GERD, arthritis, hyperplastic colon polyps, excision of scalp mass (lipoma) 12/31/13, right inguinal hernia repair (Dr. Ninfa Linden) ~'16, hemorrhoidectomy and left inguinal hernia repair (Dr. Bubba Camp) '03. He was seen by his PCP for "vasovagal syncope" in 06/2014. Patient reports he had two episodes over about a month time span. He was down in his basement working of cars. He noticed his heart was racing but no CP just prior. He noticed sweat on his arms right after. Dr. Coletta Memos did an EKG, but patient doesn't recall any further testing (or referrals). He denied any recurrent symptoms of syncope or palpitations.   PCP is Dr. Bernerd Limbo with [facility] (see Care Everywhere). He also receives care through the [facility] (primary care and psychiatry), but is in the process of transferring to the [place] location.   He denied seeing a cardiologist. He denied cardiac testing such as stress, echo, or cath. Denied carotid dopplers. Denied known CAD/MI/CHF history. Denied CVA history. His father died of "natural causes" but thought cardiac disease (no specifics known, but denied that his father had history of PCI, CABG, PPM, or AICD).   Meds include amlodipine, aspirin 81 mg, Prilosec, Paxil, prazosin.  BP (!) 156/96   Pulse 75   Temp 36.6 C   Resp 18   Ht 5\' 11"  (1.803 m)   Wt 140 lb 8 oz (63.7 kg)   SpO2 97%   BMI 19.60 kg/m   12/23/15 EKGs X 2 reviewed. SR with sinus arrhythmia, ST elevation, consider early repolarization, pericarditis or injury. ST elevation appears a little worse when compared to 12/22/13 tracing but similar to tracing from 08/31/01.  Exam shows a pleasant black male in NAD. Heart RRR. I did not appreciate a murmur or carotid bruits. No ankle  edema. Good mouth opening. He denied chest pain, SOB at rest, edema, recent syncope or palpitations. He has DOE with more strenuous activities. 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   Preoperative labs noted. BMET and CBC WNL except Ca 12.6.   Above reviewed with anesthesiologist Dr. Marcie Bal. Patient's EKG findings of ST elevation have been present on EKGs dating back to at least 2003, suggesting more of a repolarization abnormality. He denies any CV symptoms. He has not have any recurrent syncope or palpitations in > 1 year and was evaluated by his PCP at that time. If no acute changes it is anticipated that he can proceed as planned.    George Hugh [facility] Short Stay Center/Anesthesiology Phone [phone] 12/23/2015 12:17 PM

## 2015-12-23 NOTE — Progress Notes (Signed)
PCP - Dr. Bernerd Limbo Cardiologist - denies  EKG - 12/23/15 CXR - denies Echo - requested from [place] from Dr. Bernerd Limbo Cardiac Cath - denies  Patient denies chest pain and shortness of breath at PAT appointment.

## 2015-12-27 NOTE — H&P (Signed)
Brady Rodriguez 10/23/2015 11:26 AM Location: [facility] Patient #: [id number] DOB: 19-Oct-1945 Divorced / Language: Cleophus Molt / Race: Black or African American Male   History of Present Illness Nathaneil Canary A. Ninfa Linden MD; 10/23/2015 11:58 AM) Patient words: thyroid.  The patient is a 70 year old male who presents with a parathyroid neoplasm. This is a gentleman known to me status post a right inguinal hernia repair with mesh 2 years ago. He is referred today by Dr. Bernerd Limbo for a parathyroid adenoma. He has been worked up extensively by the [facility] in [facility]. He was even scheduled for surgery but they kept changing the date so he is decided he wanted done here. He also reports to me that he has had significant weight loss over the past several months and has been constipated for the last 2 years. His last colonoscopy was 5 years ago. The adenoma was found during a workup for hypercalcemia.   Problem List/Past Medical Ventura Sellers, [place]; 10/23/2015 11:26 AM) POSTOP CHECK (Z09) RIGHT INGUINAL HERNIA (K40.90) SCALP MASS (R22.0)  Other Problems Ventura Sellers, CMA; 10/23/2015 11:26 AM) Gastroesophageal Reflux Disease  Past Surgical History Ventura Sellers, [place]; 10/23/2015 11:26 AM) Colon Polyp Removal - Open Foot Surgery Bilateral.  Diagnostic Studies History Ventura Sellers, CMA; 10/23/2015 11:26 AM) Colonoscopy 1-5 years ago  Allergies Ventura Sellers, CMA; 10/23/2015 11:26 AM) No Known Drug Allergies10/16/2015  Medication History Ventura Sellers, CMA; 10/23/2015 11:27 AM) AmLODIPine Besylate (10MG  Tablet, Oral) Active. Cialis (5MG  Tablet, Oral as needed) Active. Atenolol (50MG  Tablet, Oral daily) Active. Omeprazole (20MG  Capsule DR, Oral daily) Active. TraZODone HCl (100MG  Tablet, Oral daily) Active. Medications Reconciled  Social History Ventura Sellers, [place]; 10/23/2015 11:27 AM) Alcohol use Occasional alcohol use. Tobacco  use Current every day smoker.  Family History Ventura Sellers, [place]; 10/23/2015 11:27 AM) Heart disease in male family member before age 63  Vitals (Cooper. Brooks CMA; 10/23/2015 11:26 AM) 10/23/2015 11:26 AM Weight: 139.25 lb Height: 71in Body Surface Area: 1.81 m Body Mass Index: 19.42 kg/m  BP: 132/84 (Sitting, Left Arm, Standard)    Physical Exam  The physical exam findings are as follows: Note:There are no palpable neck masses and no cervical adenopathy. His abdomen is soft and tender. Generally well in appearance Lungs clear bilaterally CV RRR Abdomen soft, NT/ND Ext with no e/c/c    Assessment & Plan  PARATHYROID ADENOMA (D35.1)  Impression: I will need to get all the records from the [place] in [place] before I can schedule any surgery. He'll be seeing them next week and I suggest he may need a CT scan or at least a colonoscopy because of the weight loss and constipation. I will see him back in 2 weeks and hopefully we will have results of the parathyroid studies so I can discuss surgery.  Addendum: The patient is a 70 year old male who presents with a parathyroid neoplasm. He is here for a follow-up of his parathyroid adenoma and hyperparathyroidism. I had now received the notes from the [place]. He indeed has a suspected adenoma on the left side. This was confirmed with a 4D CT scan. He has elevated parathyroid hormone levels and calcium. He continues to have joint pain. He is interested in surgery.  Impression: He will now be scheduled for a parathyroidectomy. I discussed the procedure with him in detail. I discussed all the risks of surgery and he wished to proceed.  These risks include but  are not limited to bleeding, infection, injury to the surrounding nerves and structures, the need to explore the entire neck for the parathyroid glands, the need for further surgery, post op hypocalcemia and the need for calcium replacement.

## 2015-12-28 ENCOUNTER — Observation Stay (HOSPITAL_COMMUNITY)
Admission: RE | Admit: 2015-12-28 | Discharge: 2015-12-29 | Disposition: A | Discharge status: Home / Self Care | Payer: Medicare Other | Source: Ambulatory Visit | Attending: Surgery | Admitting: Surgery

## 2015-12-28 ENCOUNTER — Ambulatory Visit (HOSPITAL_COMMUNITY): Payer: Medicare Other | Admitting: Anesthesiology

## 2015-12-28 ENCOUNTER — Encounter (HOSPITAL_COMMUNITY): Payer: Self-pay | Admitting: *Deleted

## 2015-12-28 ENCOUNTER — Ambulatory Visit (HOSPITAL_COMMUNITY): Payer: Medicare Other | Admitting: Vascular Surgery

## 2015-12-28 ENCOUNTER — Encounter (HOSPITAL_COMMUNITY)
Admission: RE | Disposition: A | Discharge status: Home / Self Care | Payer: Self-pay | Source: Ambulatory Visit | Attending: Surgery

## 2015-12-28 DIAGNOSIS — Z7982 Long term (current) use of aspirin: Secondary | ICD-10-CM | POA: Insufficient documentation

## 2015-12-28 DIAGNOSIS — K219 Gastro-esophageal reflux disease without esophagitis: Secondary | ICD-10-CM | POA: Diagnosis not present

## 2015-12-28 DIAGNOSIS — I10 Essential (primary) hypertension: Secondary | ICD-10-CM | POA: Diagnosis not present

## 2015-12-28 DIAGNOSIS — Z79899 Other long term (current) drug therapy: Secondary | ICD-10-CM | POA: Diagnosis not present

## 2015-12-28 DIAGNOSIS — F17200 Nicotine dependence, unspecified, uncomplicated: Secondary | ICD-10-CM | POA: Insufficient documentation

## 2015-12-28 DIAGNOSIS — D351 Benign neoplasm of parathyroid gland: Secondary | ICD-10-CM | POA: Diagnosis present

## 2015-12-28 DIAGNOSIS — E210 Primary hyperparathyroidism: Secondary | ICD-10-CM | POA: Insufficient documentation

## 2015-12-28 HISTORY — PX: PARATHYROIDECTOMY: SHX19

## 2015-12-28 LAB — BASIC METABOLIC PANEL
Anion gap: 8 (ref 5–15)
BUN: 14 mg/dL (ref 6–20)
CHLORIDE: 104 mmol/L (ref 101–111)
CO2: 25 mmol/L (ref 22–32)
CREATININE: 0.93 mg/dL (ref 0.61–1.24)
Calcium: 11 mg/dL — ABNORMAL HIGH (ref 8.9–10.3)
GFR calc Af Amer: >60 mL/min (ref >60)
GFR calc non Af Amer: >60 mL/min (ref >60)
GLUCOSE: 134 mg/dL — AB (ref 65–99)
POTASSIUM: 3.8 mmol/L (ref 3.5–5.1)
SODIUM: 137 mmol/L (ref 135–145)

## 2015-12-28 LAB — GLUCOSE, CAPILLARY: GLUCOSE-CAPILLARY: 156 mg/dL — AB (ref 65–99)

## 2015-12-28 SURGERY — PARATHYROIDECTOMY
Anesthesia: General | Site: Neck

## 2015-12-28 MED ORDER — DEXAMETHASONE SODIUM PHOSPHATE 10 MG/ML IJ SOLN
INTRAMUSCULAR | Status: DC | PRN
  Administered 2015-12-28: 10 mg via INTRAVENOUS

## 2015-12-28 MED ORDER — ENOXAPARIN SODIUM 40 MG/0.4ML ~~LOC~~ SOLN
40.0000 mg | SUBCUTANEOUS | Status: DC
  Administered 2015-12-29: 40 mg via SUBCUTANEOUS
  Filled 2015-12-28: qty 0.4

## 2015-12-28 MED ORDER — ROCURONIUM BROMIDE 100 MG/10ML IV SOLN
INTRAVENOUS | Status: DC | PRN
  Administered 2015-12-28: 50 mg via INTRAVENOUS

## 2015-12-28 MED ORDER — DEXAMETHASONE SODIUM PHOSPHATE 10 MG/ML IJ SOLN
INTRAMUSCULAR | Status: AC
  Filled 2015-12-28: qty 1

## 2015-12-28 MED ORDER — CHLORHEXIDINE GLUCONATE CLOTH 2 % EX PADS: 6.0000 | MEDICATED_PAD | Freq: Once | CUTANEOUS | Status: DC

## 2015-12-28 MED ORDER — LIDOCAINE 2% (20 MG/ML) 5 ML SYRINGE
INTRAMUSCULAR | Status: AC
  Filled 2015-12-28: qty 5

## 2015-12-28 MED ORDER — PRAZOSIN HCL 5 MG PO CAPS
5.0000 mg | ORAL_CAPSULE | Freq: Every day | ORAL | Status: DC
  Administered 2015-12-29: 5 mg via ORAL
  Filled 2015-12-28: qty 1

## 2015-12-28 MED ORDER — ONDANSETRON 4 MG PO TBDP: 4.0000 mg | ORAL_TABLET | Freq: Four times a day (QID) | ORAL | Status: DC | PRN

## 2015-12-28 MED ORDER — LACTATED RINGERS IV SOLN
INTRAVENOUS | Status: DC
  Administered 2015-12-28: 10:00:00 via INTRAVENOUS

## 2015-12-28 MED ORDER — MIDAZOLAM HCL 2 MG/2ML IJ SOLN
INTRAMUSCULAR | Status: DC | PRN
  Administered 2015-12-28: 2 mg via INTRAVENOUS

## 2015-12-28 MED ORDER — CEFAZOLIN SODIUM-DEXTROSE 2-4 GM/100ML-% IV SOLN
2.0000 g | INTRAVENOUS | Status: AC
  Administered 2015-12-28: 2 g via INTRAVENOUS
  Filled 2015-12-28: qty 100

## 2015-12-28 MED ORDER — MIDAZOLAM HCL 2 MG/2ML IJ SOLN
INTRAMUSCULAR | Status: AC
  Filled 2015-12-28: qty 2

## 2015-12-28 MED ORDER — LIDOCAINE HCL (CARDIAC) 20 MG/ML IV SOLN
INTRAVENOUS | Status: DC | PRN
  Administered 2015-12-28: 60 mg via INTRAVENOUS

## 2015-12-28 MED ORDER — HYDRALAZINE HCL 20 MG/ML IJ SOLN
INTRAMUSCULAR | Status: AC
  Filled 2015-12-28: qty 1

## 2015-12-28 MED ORDER — FENTANYL CITRATE (PF) 100 MCG/2ML IJ SOLN
INTRAMUSCULAR | Status: AC
  Filled 2015-12-28: qty 2

## 2015-12-28 MED ORDER — ONDANSETRON HCL 4 MG/2ML IJ SOLN
INTRAMUSCULAR | Status: DC | PRN
  Administered 2015-12-28: 4 mg via INTRAVENOUS

## 2015-12-28 MED ORDER — POTASSIUM CHLORIDE IN NACL 20-0.9 MEQ/L-% IV SOLN
INTRAVENOUS | Status: DC
  Administered 2015-12-28: 16:00:00 via INTRAVENOUS
  Filled 2015-12-28: qty 1000

## 2015-12-28 MED ORDER — AMLODIPINE BESYLATE 10 MG PO TABS
10.0000 mg | ORAL_TABLET | Freq: Every day | ORAL | Status: DC
  Administered 2015-12-29: 10 mg via ORAL
  Filled 2015-12-28: qty 1

## 2015-12-28 MED ORDER — HEMOSTATIC AGENTS (NO CHARGE) OPTIME
TOPICAL | Status: DC | PRN
  Administered 2015-12-28: 1 via TOPICAL

## 2015-12-28 MED ORDER — PROPOFOL 10 MG/ML IV BOLUS
INTRAVENOUS | Status: DC | PRN
  Administered 2015-12-28: 150 mg via INTRAVENOUS

## 2015-12-28 MED ORDER — MORPHINE SULFATE (PF) 2 MG/ML IV SOLN: 1.0000 mg | INTRAVENOUS | Status: DC | PRN

## 2015-12-28 MED ORDER — PRAZOSIN HCL 2 MG PO CAPS
10.0000 mg | ORAL_CAPSULE | Freq: Every day | ORAL | Status: DC
  Administered 2015-12-28: 10 mg via ORAL
  Filled 2015-12-28: qty 2

## 2015-12-28 MED ORDER — HYDROMORPHONE HCL 1 MG/ML IJ SOLN
INTRAMUSCULAR | Status: AC
  Filled 2015-12-28: qty 0.5

## 2015-12-28 MED ORDER — METOPROLOL TARTRATE 5 MG/5ML IV SOLN
2.0000 mg | Freq: Once | INTRAVENOUS | Status: AC
  Administered 2015-12-28: 2 mg via INTRAVENOUS

## 2015-12-28 MED ORDER — 0.9 % SODIUM CHLORIDE (POUR BTL) OPTIME
TOPICAL | Status: DC | PRN
  Administered 2015-12-28: 1000 mL

## 2015-12-28 MED ORDER — HYDRALAZINE HCL 20 MG/ML IJ SOLN
INTRAMUSCULAR | Status: DC | PRN
  Administered 2015-12-28 (×3): 5 mg via INTRAVENOUS

## 2015-12-28 MED ORDER — ONDANSETRON HCL 4 MG/2ML IJ SOLN
INTRAMUSCULAR | Status: AC
  Filled 2015-12-28: qty 2

## 2015-12-28 MED ORDER — SUGAMMADEX SODIUM 200 MG/2ML IV SOLN
INTRAVENOUS | Status: DC | PRN
  Administered 2015-12-28: 130 mg via INTRAVENOUS

## 2015-12-28 MED ORDER — FENTANYL CITRATE (PF) 100 MCG/2ML IJ SOLN
INTRAMUSCULAR | Status: DC | PRN
  Administered 2015-12-28: 50 ug via INTRAVENOUS
  Administered 2015-12-28 (×2): 25 ug via INTRAVENOUS
  Administered 2015-12-28 (×2): 100 ug via INTRAVENOUS

## 2015-12-28 MED ORDER — PROMETHAZINE HCL 25 MG/ML IJ SOLN: 6.2500 mg | INTRAMUSCULAR | Status: DC | PRN

## 2015-12-28 MED ORDER — OXYCODONE HCL 5 MG PO TABS
5.0000 mg | ORAL_TABLET | ORAL | Status: DC | PRN
  Administered 2015-12-28 – 2015-12-29 (×3): 10 mg via ORAL
  Filled 2015-12-28 (×3): qty 2

## 2015-12-28 MED ORDER — PRAZOSIN HCL 5 MG PO CAPS
5.0000 mg | ORAL_CAPSULE | Freq: Every day | ORAL | Status: DC
  Administered 2015-12-28: 5 mg via ORAL
  Filled 2015-12-28: qty 1

## 2015-12-28 MED ORDER — METOPROLOL TARTRATE 5 MG/5ML IV SOLN
INTRAVENOUS | Status: AC
  Filled 2015-12-28: qty 5

## 2015-12-28 MED ORDER — ROCURONIUM BROMIDE 10 MG/ML (PF) SYRINGE
PREFILLED_SYRINGE | INTRAVENOUS | Status: AC
  Filled 2015-12-28: qty 10

## 2015-12-28 MED ORDER — PROPOFOL 10 MG/ML IV BOLUS
INTRAVENOUS | Status: AC
  Filled 2015-12-28: qty 20

## 2015-12-28 MED ORDER — ONDANSETRON HCL 4 MG/2ML IJ SOLN: 4.0000 mg | Freq: Four times a day (QID) | INTRAMUSCULAR | Status: DC | PRN

## 2015-12-28 MED ORDER — FENTANYL CITRATE (PF) 100 MCG/2ML IJ SOLN
INTRAMUSCULAR | Status: AC
  Filled 2015-12-28: qty 4

## 2015-12-28 MED ORDER — HYDROMORPHONE HCL 1 MG/ML IJ SOLN
0.2500 mg | INTRAMUSCULAR | Status: DC | PRN
  Administered 2015-12-28: 0.5 mg via INTRAVENOUS

## 2015-12-28 SURGICAL SUPPLY — 46 items
ADH SKN CLS APL DERMABOND .7 (GAUZE/BANDAGES/DRESSINGS) ×1
CANISTER SUCTION 2500CC (MISCELLANEOUS) ×3 IMPLANT
CHLORAPREP W/TINT 26ML (MISCELLANEOUS) ×3 IMPLANT
CLIP TI MEDIUM 24 (CLIP) ×3 IMPLANT
CLIP TI WIDE RED SMALL 24 (CLIP) ×3 IMPLANT
CONT SPEC 4OZ CLIKSEAL STRL BL (MISCELLANEOUS) ×4 IMPLANT
COVER SURGICAL LIGHT HANDLE (MISCELLANEOUS) ×3 IMPLANT
CRADLE DONUT ADULT HEAD (MISCELLANEOUS) ×3 IMPLANT
DERMABOND ADVANCED (GAUZE/BANDAGES/DRESSINGS) ×2
DERMABOND ADVANCED .7 DNX12 (GAUZE/BANDAGES/DRESSINGS) ×1 IMPLANT
DRAPE LAPAROTOMY 100X72 PEDS (DRAPES) ×3 IMPLANT
DRAPE UTILITY XL STRL (DRAPES) ×6 IMPLANT
ELECT CAUTERY BLADE 6.4 (BLADE) ×3 IMPLANT
ELECT REM PT RETURN 9FT ADLT (ELECTROSURGICAL) ×3
ELECTRODE REM PT RTRN 9FT ADLT (ELECTROSURGICAL) ×1 IMPLANT
GAUZE SPONGE 4X4 12PLY STRL (GAUZE/BANDAGES/DRESSINGS) ×3 IMPLANT
GAUZE SPONGE 4X4 16PLY XRAY LF (GAUZE/BANDAGES/DRESSINGS) ×4 IMPLANT
GLOVE BIOGEL PI IND STRL 7.0 (GLOVE) IMPLANT
GLOVE BIOGEL PI IND STRL 8 (GLOVE) IMPLANT
GLOVE BIOGEL PI INDICATOR 7.0 (GLOVE) ×2
GLOVE BIOGEL PI INDICATOR 8 (GLOVE) ×2
GLOVE ECLIPSE 7.5 STRL STRAW (GLOVE) ×2 IMPLANT
GLOVE SURG SIGNA 7.5 PF LTX (GLOVE) ×3 IMPLANT
GLOVE SURG SS PI 7.0 STRL IVOR (GLOVE) ×2 IMPLANT
GOWN STRL REUS W/ TWL LRG LVL3 (GOWN DISPOSABLE) ×1 IMPLANT
GOWN STRL REUS W/ TWL XL LVL3 (GOWN DISPOSABLE) ×1 IMPLANT
GOWN STRL REUS W/TWL LRG LVL3 (GOWN DISPOSABLE) ×3
GOWN STRL REUS W/TWL XL LVL3 (GOWN DISPOSABLE) ×3
HEMOSTAT SNOW SURGICEL 2X4 (HEMOSTASIS) ×2 IMPLANT
KIT BASIN OR (CUSTOM PROCEDURE TRAY) ×3 IMPLANT
KIT ROOM TURNOVER OR (KITS) ×3 IMPLANT
NS IRRIG 1000ML POUR BTL (IV SOLUTION) ×3 IMPLANT
PACK SURGICAL SETUP 50X90 (CUSTOM PROCEDURE TRAY) ×3 IMPLANT
PAD ARMBOARD 7.5X6 YLW CONV (MISCELLANEOUS) ×6 IMPLANT
PENCIL BUTTON HOLSTER BLD 10FT (ELECTRODE) ×3 IMPLANT
SHEARS HARMONIC 9CM CVD (BLADE) IMPLANT
SPECIMEN JAR SMALL (MISCELLANEOUS) ×2 IMPLANT
SPONGE INTESTINAL PEANUT (DISPOSABLE) ×4 IMPLANT
SUT MNCRL AB 4-0 PS2 18 (SUTURE) ×2 IMPLANT
SUT SILK 3 0 (SUTURE) ×3
SUT SILK 3-0 18XBRD TIE 12 (SUTURE) ×1 IMPLANT
SUT VIC AB 3-0 SH 18 (SUTURE) ×4 IMPLANT
SYR BULB 3OZ (MISCELLANEOUS) ×3 IMPLANT
TOWEL OR 17X26 10 PK STRL BLUE (TOWEL DISPOSABLE) ×3 IMPLANT
TUBE CONNECTING 12'X1/4 (SUCTIONS) ×1
TUBE CONNECTING 12X1/4 (SUCTIONS) ×2 IMPLANT

## 2015-12-28 NOTE — Anesthesia Postprocedure Evaluation (Signed)
Anesthesia Post Note  Patient: Brady Rodriguez  Procedure(s) Performed: Procedure(s) (LRB): PARATHYROIDECTOMY (N/A)  Patient location during evaluation: PACU Anesthesia Type: General Level of consciousness: awake and alert Pain management: pain level controlled Vital Signs Assessment: post-procedure vital signs reviewed and stable Respiratory status: spontaneous breathing, nonlabored ventilation, respiratory function stable and patient connected to nasal cannula oxygen Cardiovascular status: blood pressure returned to baseline and stable Postop Assessment: no signs of nausea or vomiting Anesthetic complications: no    Last Vitals:  Vitals:   12/28/15 1142 12/28/15 1200  BP: (!) 181/63 (!) 166/89  Pulse: 93   Resp: 12   Temp:      Last Pain:  Vitals:   12/28/15 1200  TempSrc:   PainSc: 5                  [name]

## 2015-12-28 NOTE — Anesthesia Procedure Notes (Signed)
Procedure Name: Intubation Date/Time: 12/28/2015 10:12 AM Performed by: Sampson Si E Pre-anesthesia Checklist: Patient identified, Emergency Drugs available, Suction available and Patient being monitored Patient Re-evaluated:Patient Re-evaluated prior to inductionOxygen Delivery Method: Circle System Utilized Preoxygenation: Pre-oxygenation with 100% oxygen Intubation Type: IV induction Ventilation: Mask ventilation without difficulty Laryngoscope Size: Mac and 3 Grade View: Grade I Tube type: Oral Tube size: 7.5 mm Number of attempts: 1 Airway Equipment and Method: Stylet and Oral airway Placement Confirmation: ETT inserted through vocal cords under direct vision,  positive ETCO2 and breath sounds checked- equal and bilateral Secured at: 22 cm Tube secured with: Tape Dental Injury: Teeth and Oropharynx as per pre-operative assessment

## 2015-12-28 NOTE — Transfer of Care (Signed)
Immediate Anesthesia Transfer of Care Note  Patient: Brady Rodriguez  Procedure(s) Performed: Procedure(s): PARATHYROIDECTOMY (N/A)  Patient Location: PACU  Anesthesia Type:General  Level of Consciousness: lethargic and responds to stimulation  Airway & Oxygen Therapy: Patient Spontanous Breathing and Patient connected to nasal cannula oxygen  Post-op Assessment: Report given to RN  Post vital signs: Reviewed and stable  Last Vitals:  Vitals:   12/28/15 0856 12/28/15 1124  BP: (!) 170/96   Pulse: 64   Resp: 18   Temp: 36.7 C (P) 36.9 C    Last Pain:  Vitals:   12/28/15 0856  TempSrc: Oral      Patients Stated Pain Goal: 3 ([id number])  Complications: No apparent anesthesia complications

## 2015-12-28 NOTE — Interval H&P Note (Signed)
History and Physical Interval Note:  No change in H and P 12/28/2015 9:30 AM  Brady Rodriguez  has presented today for surgery, with the diagnosis of Parathyroid adenoma  The various methods of treatment have been discussed with the patient and family. After consideration of risks, benefits and other options for treatment, the patient has consented to  Procedure(s): PARATHYROIDECTOMY (N/A) as a surgical intervention .  The patient's history has been reviewed, patient examined, no change in status, stable for surgery.  I have reviewed the patient's chart and labs.  Questions were answered to the patient's satisfaction.     [name]

## 2015-12-28 NOTE — Op Note (Signed)
PARATHYROIDECTOMY  Procedure Note  Brady Rodriguez 12/28/2015   Pre-op Diagnosis: Parathyroid adenoma     Post-op Diagnosis: same  Procedure(s): PARATHYROIDECTOMY (left inferior parathyroid gland  Surgeon(s): Coralie Keens, MD Judeth Horn, MD  Anesthesia: General  Staff:  Circulator: Harrel Lemon, RN Scrub Person: Christen Bame, RN  Estimated Blood Loss: less than 50 mL               Specimens: sent to path  Indications: This gentleman presented with hypercalcemia. He had a workup at the [facility] showing an elevated parathyroid hormone level. A 4D reconstructed CT of the neck suggested a left inferior parathyroid adenoma which was 2.5  cm in size. The decision was made to proceed to the operating room for parathyroidectomy.  Findings: The patient was found to have a calcified nodule on the lower lobe of the left side of the thyroid gland. It was felt to be partially intrathyroidal. I excised this and frozen section showed hyperplastic parathyroid tissue. No other masses were then a final left side of the neck.  Procedure: The patient was brought to the operating room and identified as the correct patient. He was placed supine on the operating room table and general anesthesia was induced. His neck was then prepped and draped in the usual sterile fashion. I made a longitudinal incision across the left lower side of the neck with a scalpel. I took this down to the platysma which was then opened up with the cautery. I then performed in fair and superior skin flaps. Identified the midline and opened this up and retracted the strap muscles laterally. The thyroid gland was identified. I dissected along the gland and easily identified the middle thyroidal vein as well as the recurrent laryngeal nerve. The vein was clipped with surgical clips. I was then able to palpate a moderate size nodule at the inferior pole of the thyroid gland on the left side. No other nodules are identified.  This nodule is felt to be partially in thyroidal. I excised part of it and sent to pathology for evaluation. Frozen section revealed hyperplastic parathyroid tissue. I then excised the nodule including some thyroid tissue completed the electrocautery. I was careful to spare the left recurrent laryngeal nerve. While we are waiting for frozen section, we explored the left side of the neck and found no other nodules. I also took the dissection to the right inferior thyroid lobe and could palpate no abnormalities there is well. When the frozen section again came back confirming parathyroid tissue, no other exploration was performed. I then irrigated the wound thoroughly with saline. Hemostasis appeared to be achieved with the cautery as well as several pieces of surgical snow. I then reapproximated line with several interrupted 3-0 Vicryl sutures. I then closed the platysma with interrupted 3-0 Vicryl sutures and closed the skin with a running 4-0 Monocryl suture. Skin glue was then applied. The patient tolerated the procedure well. All the counts were correct at the end procedure. The patient was then divided in the operating room and taken in a stable condition to the recovery room.          Brady Rodriguez A   Date: 12/28/2015  Time: 11:21 AM

## 2015-12-28 NOTE — Anesthesia Preprocedure Evaluation (Addendum)
Anesthesia Evaluation  Patient identified by MRN, date of birth, ID band Patient awake    Reviewed: Allergy & Precautions, NPO status , Patient's Chart, lab work & pertinent test results  Airway Mallampati: II  TM Distance: >3 FB Neck ROM: Full    Dental no notable dental hx. (+) Teeth Intact, Dental Advisory Given   Pulmonary Current Smoker,    Pulmonary exam normal breath sounds clear to auscultation       Cardiovascular hypertension, Normal cardiovascular exam Rhythm:Regular Rate:Normal     Neuro/Psych negative neurological ROS  negative psych ROS   GI/Hepatic negative GI ROS, Neg liver ROS,   Endo/Other  negative endocrine ROS  Renal/GU negative Renal ROS  negative genitourinary   Musculoskeletal negative musculoskeletal ROS (+)   Abdominal   Peds negative pediatric ROS (+)  Hematology negative hematology ROS (+)   Anesthesia Other Findings   Reproductive/Obstetrics negative OB ROS                            Anesthesia Physical Anesthesia Plan  ASA: II  Anesthesia Plan: General   Post-op Pain Management:    Induction: Intravenous  Airway Management Planned: Oral ETT  Additional Equipment:   Intra-op Plan:   Post-operative Plan: Extubation in OR  Informed Consent: I have reviewed the patients History and Physical, chart, labs and discussed the procedure including the risks, benefits and alternatives for the proposed anesthesia with the patient or authorized representative who has indicated his/her understanding and acceptance.   Dental advisory given  Plan Discussed with: CRNA and Surgeon  Anesthesia Plan Comments:         Anesthesia Quick Evaluation

## 2015-12-29 ENCOUNTER — Encounter (HOSPITAL_COMMUNITY): Payer: Self-pay | Admitting: Surgery

## 2015-12-29 DIAGNOSIS — D351 Benign neoplasm of parathyroid gland: Secondary | ICD-10-CM | POA: Diagnosis not present

## 2015-12-29 LAB — BASIC METABOLIC PANEL
ANION GAP: 3 — AB (ref 5–15)
BUN: 18 mg/dL (ref 6–20)
CO2: 26 mmol/L (ref 22–32)
Calcium: 9.9 mg/dL (ref 8.9–10.3)
Chloride: 109 mmol/L (ref 101–111)
Creatinine, Ser: 0.96 mg/dL (ref 0.61–1.24)
Glucose, Bld: 128 mg/dL — ABNORMAL HIGH (ref 65–99)
POTASSIUM: 3.8 mmol/L (ref 3.5–5.1)
SODIUM: 138 mmol/L (ref 135–145)

## 2015-12-29 MED ORDER — CALCIUM CARBONATE 1250 (500 CA) MG PO CHEW: 2.0000 | CHEWABLE_TABLET | Freq: Two times a day (BID) | ORAL | 0 refills | Status: AC

## 2015-12-29 MED ORDER — OXYCODONE-ACETAMINOPHEN 5-325 MG PO TABS: 1.0000 | ORAL_TABLET | ORAL | 0 refills | Status: DC | PRN

## 2015-12-29 NOTE — Discharge Instructions (Signed)
[facility], [place] [phone]  THYROID/ PARATHYROID SURGERY: POST OP INSTRUCTIONS  Always review your discharge instruction sheet given to you by the facility where your surgery was performed.  IF YOU HAVE DISABILITY OR FAMILY LEAVE FORMS, YOU MUST BRING THEM TO THE OFFICE FOR PROCESSING.  PLEASE DO NOT GIVE THEM TO YOUR DOCTOR.  1. A prescription for pain medication may be given to you upon discharge.  Take your pain medication as prescribed, if needed.  If narcotic pain medicine is not needed, then you may take acetaminophen (Tylenol) or ibuprofen (Advil) as needed. 2. Take your usually prescribed medications unless otherwise directed. 3. If you need a refill on your pain medication, please contact your pharmacy. They will contact our office to request authorization.  Prescriptions will not be filled after 5pm or on week-ends. 4. You should follow a light diet the first 24 hours after arrival home, such as soup and crackers, etc.  Be sure to include lots of fluids daily.  Resume your normal diet the day after surgery. 5. Most patients will experience some swelling and bruising on the chest and neck area.  Ice packs will help.  Swelling and bruising can take several days to resolve.  6. It is common to experience some constipation if taking pain medication after surgery.  Increasing fluid intake and taking a stool softener will usually help or prevent this problem from occurring.  A mild laxative (Milk of Magnesia or Miralax) should be taken according to package directions if there are no bowel movements after 48 hours. 7. Unless discharge instructions indicate otherwise, you may remove your bandages 24-48 hours after surgery, and you may shower at that time.  You may have steri-strips (small skin tapes) in place directly over the incision.  These strips should be left on the skin for 7-10 days.  If your surgeon used skin glue on the incision, you may shower in 24 hours.  The  glue will flake off over the next 2-3 weeks.  Any sutures or staples will be removed at the office during your follow-up visit. 8. ACTIVITIES:  You may resume regular (light) daily activities beginning the next day--such as daily self-care, walking, climbing stairs--gradually increasing activities as tolerated.  You may have sexual intercourse when it is comfortable.  Refrain from any heavy lifting or straining until approved by your doctor. a. You may drive when you no longer are taking prescription pain medication, you can comfortably wear a seatbelt, and you can safely maneuver your car and apply brakes b. RETURN TO WORK:  __________________________________________________________ 9. You should see your doctor in the office for a follow-up appointment approximately two weeks after your surgery.  Make sure that you call for this appointment within a day or two after you arrive home to insure a convenient appointment time. 10. OTHER INSTRUCTIONS: ____ok to shower today________________________________________________________________________ _________________________________________________________________________________________________________________ _________________________________________________________________________________________________________________   [id number] TO CALL YOUR DOCTOR: 1. Fever over 101.0 2. Inability to urinate 3. Nausea and/or vomiting 4. Extreme swelling or bruising 5. Continued bleeding from incision. 6. Increased pain, redness, or drainage from the incision. 7. Difficulty swallowing or breathing 8. Muscle cramping or spasms. 9. Numbness or tingling in hands or feet or around lips.  The clinic staff is available to answer your questions during regular business hours.  Please dont hesitate to call and ask to speak to one of the nurses if you have concerns.  For further questions, please visit [email or web address]

## 2015-12-29 NOTE — Discharge Summary (Signed)
Physician Discharge Summary  Patient ID: Brady Rodriguez MRN: [id number] DOB/AGE: 1945-03-05 70 y.o.  Admit date: 12/28/2015 Discharge date: 12/29/2015  Admission Diagnoses:  Discharge Diagnoses:  Active Problems:   Parathyroid adenoma   Discharged Condition: good  Hospital Course: uneventful post op recovery.  Calcium level normal on POD#1  Consults: None  Significant Diagnostic Studies:   Treatments: surgery: parathyroidectomy (left inferior)  Discharge Exam: Blood pressure 127/67, pulse 82, temperature 99.8 F (37.7 C), temperature source Oral, resp. rate 14, height 5\' 11"  (1.803 m), weight 63.7 kg (140 lb 8 oz), SpO2 98 %. General appearance: alert, cooperative and no distress Resp: clear to auscultation bilaterally Cardio: regular rate and rhythm, S1, S2 normal, no murmur, click, rub or gallop Incision/Wound:neck incision clean, no hematoma  Disposition: 01-Home or Self Care     Medication List    TAKE these medications   amLODipine 10 MG tablet Commonly known as:  NORVASC Take 10 mg by mouth daily.   aspirin EC 81 MG tablet Take 81 mg by mouth daily.   calcium carbonate 1250 (500 Ca) MG chewable tablet Commonly known as:  OS-CAL Chew 2 tablets (2,500 mg total) by mouth 2 (two) times daily.   omeprazole 20 MG capsule Commonly known as:  PRILOSEC Take 20 mg by mouth daily.   oxyCODONE-acetaminophen 5-325 MG tablet Commonly known as:  ROXICET Take 1-2 tablets by mouth every 4 (four) hours as needed for severe pain.   PARoxetine 40 MG tablet Commonly known as:  PAXIL Take 40 mg by mouth every morning.   prazosin 5 MG capsule Commonly known as:  MINIPRESS Take 5-10 mg by mouth See admin instructions. Take 5 mg by mouth in the morning and take 10 mg by mouth at bedtime      Follow-up Information    Kismet Facemire A, MD. Schedule an appointment as soon as possible for a visit in 3 week(s).   Specialty:  General Surgery Contact  information: [address] [place] [address] [phone]           Signed: Harl Bowie 12/29/2015, 6:47 AM

## 2015-12-29 NOTE — Progress Notes (Signed)
Patient ID: Brady Rodriguez, male   DOB: Aug 29, 1945, 70 y.o.   MRN: [id number]   Doing well No complaints Neck incision clean No hematoma Voice normal Ca+ normal  Plan: discharge

## 2015-12-29 NOTE — Progress Notes (Signed)
Discharge home. Home discharge instruction given, no question verbalized. 

## 2016-02-25 DIAGNOSIS — Z9889 Other specified postprocedural states: Secondary | ICD-10-CM | POA: Insufficient documentation

## 2016-02-25 DIAGNOSIS — E892 Postprocedural hypoparathyroidism: Secondary | ICD-10-CM | POA: Insufficient documentation

## 2016-03-07 ENCOUNTER — Ambulatory Visit (INDEPENDENT_AMBULATORY_CARE_PROVIDER_SITE_OTHER): Payer: Medicare Other | Admitting: Podiatry

## 2016-03-07 ENCOUNTER — Encounter: Payer: Self-pay | Admitting: Podiatry

## 2016-03-07 DIAGNOSIS — Q828 Other specified congenital malformations of skin: Secondary | ICD-10-CM | POA: Diagnosis not present

## 2016-03-07 NOTE — Progress Notes (Signed)
   Subjective:    Patient ID: Brady Rodriguez, male    DOB: 12-27-1945, 71 y.o.   MRN: [id number]  HPI  71 year old male presents the office today for concerns of a painful lesion from his right foot which is been ongoing for several years. He states his been cut out previously as well as frozen but does continue to come back. He is tried over-the-counter treatments as well. The areas painful to pressure in  Denies any swelling or redness or any drainage from the area. Denies stepping any foreign objects. He has no other complaints today.  Review of Systems  All other systems reviewed and are negative.      Objective:   Physical Exam General: AAO x3, NAD  Dermatological: Hyperkeratotic lesion right foot submetatarsal 3. This is a punctate deep annular hyperkeratotic lesion. There is no pinpoint bleeding present. There is no other lesions identified. Upon debridement there is no underlying ulceration, drainage or any signs of infection.  Vascular: Dorsalis Pedis artery and Posterior Tibial artery pedal pulses are 2/4 bilateral with immedate capillary fill time. There is no pain with calf compression, swelling, warmth, erythema.   Neruologic: Grossly intact via light touch bilateral. Vibratory intact via tuning fork bilateral. Protective threshold with Semmes Wienstein monofilament intact to all pedal sites bilateral.   Musculoskeletal: Tenderness hyperkeratotic lesion right second metatarsal 3.Upon debridement there was resolution of pain. No other areas of tenderness bilaterally.  Muscular strength 5/5 in all groups tested bilateral.  Gait: Unassisted, Nonantalgic.     Assessment & Plan:  Right submetatarsal 3 porokeratosis -Treatment options discussed including all alternatives, risks, and complications -Etiology of symptoms were discussed -Hyperkeratotic lesion sharply debrided x 1 without complications or bleeding. The area was cleaned followed by a pad, salinocaine, and an occlusive  bandage. Post-procedure injections were discussed. Monitor for infection. -Offloading pads were dispensed to help prevent recurrence. -Follow-up as scheduled or sooner if needed. Call any questions or concerns the meantime.  Celesta Gentile, DPM

## 2016-03-07 NOTE — Patient Instructions (Signed)
Take dressing off in 8 hours and wash the foot with soap and water. If it is hurting or becomes uncomfortable before the 8 hours, go ahead and remove the bandage and wash the area.  If it blisters, apply antibiotic ointment and a band-aid.  Monitor for any signs/symptoms of infection. Call the office immediately if any occur or go directly to the emergency room. Call with any questions/concerns.   

## 2016-03-28 ENCOUNTER — Ambulatory Visit (INDEPENDENT_AMBULATORY_CARE_PROVIDER_SITE_OTHER): Payer: Medicare Other | Admitting: Podiatry

## 2016-03-28 ENCOUNTER — Encounter: Payer: Self-pay | Admitting: Podiatry

## 2016-03-28 VITALS — BP 113/63 | HR 101 | Resp 18

## 2016-03-28 DIAGNOSIS — M79673 Pain in unspecified foot: Secondary | ICD-10-CM

## 2016-03-28 DIAGNOSIS — Q828 Other specified congenital malformations of skin: Secondary | ICD-10-CM

## 2016-03-30 NOTE — Progress Notes (Signed)
Subjective: 71 year old male presents the office they for follow-up evaluation of painful lesion to the right foot submetatarsal 3. He states the areas doing much better and the area is not painful like it was. He had no problems after the last treatment of salicylic acid.Denies any drainage or redness or any swelling. Denies any systemic complaints such as fevers, chills, nausea, vomiting. No acute changes since last appointment, and no other complaints at this time.   Objective: AAO x3, NAD DP/PT pulses palpable bilaterally, CRT less than 3 seconds Right foot submetatarsal 3 was a hyperkeratotic lesion. Upon debridement this appeared to be a porokeratosis. There is no underlying ulceration, drainage or foreign body evident. No open lesions or pre-ulcerative lesions.  No pain with calf compression, swelling, warmth, erythema  Assessment: Right second metatarsal 3 porokeratosis  Plan: -All treatment options discussed with the patient including all alternatives, risks, complications.  -Lesion was sharply debrided today 1 without complications or bleeding. The area was cleaned and a pad was placed followed by salicylic acid and a bandage. Post procedure instructions discussed. Monitor for infection.  -RTC if not improved in the next 3-4 weeks or sooner if needed.  -Patient encouraged to call the office with any questions, concerns, change in symptoms.   Celesta Gentile, DPM

## 2016-04-25 ENCOUNTER — Ambulatory Visit: Payer: Medicare Other | Admitting: Podiatry

## 2016-05-02 ENCOUNTER — Ambulatory Visit (INDEPENDENT_AMBULATORY_CARE_PROVIDER_SITE_OTHER): Payer: Medicare Other | Admitting: Podiatry

## 2016-05-02 DIAGNOSIS — Q828 Other specified congenital malformations of skin: Secondary | ICD-10-CM | POA: Diagnosis not present

## 2016-05-02 NOTE — Progress Notes (Signed)
Subjective: 71 year old male presents the office they for follow-up evaluation of painful lesion to the right foot submetatarsal 3. He states the areas doing much better and only has minimal pain. He had no problems after the last treatment of salicylic acid.Denies any drainage or redness or any swelling. Denies any systemic complaints such as fevers, chills, nausea, vomiting. No acute changes since last appointment, and no other complaints at this time.   Objective: AAO x3, NAD DP/PT pulses palpable bilaterally, CRT less than 3 seconds Right foot submetatarsal 3 was a hyperkeratotic lesion. Upon debridement this appeared to be a porokeratosis. There is no underlying ulceration, drainage or foreign body evident. No open lesions or pre-ulcerative lesions.  No pain with calf compression, swelling, warmth, erythema  Assessment: Right second metatarsal 3 porokeratosis  Plan: -All treatment options discussed with the patient including all alternatives, risks, complications.  -Lesion was sharply debrided today 1 without complications or bleeding. The area was cleaned and a pad was placed followed by salicylic acid and a bandage. Post procedure instructions discussed. Monitor for infection.  -If symptoms not improving discussed CMO.  -RTC if not improved in the next 4-6 weeks or sooner if needed.  -Patient encouraged to call the office with any questions, concerns, change in symptoms.   Celesta Gentile, DPM

## 2016-06-10 ENCOUNTER — Encounter: Payer: Self-pay | Admitting: Radiation Oncology

## 2016-06-13 ENCOUNTER — Encounter: Payer: Self-pay | Admitting: Radiation Oncology

## 2016-06-21 DIAGNOSIS — C61 Malignant neoplasm of prostate: Secondary | ICD-10-CM | POA: Insufficient documentation

## 2016-06-21 NOTE — Progress Notes (Signed)
GU Location of Tumor / Histology: prostatic adenocarcinoma  If Prostate Cancer, Gleason Score is (3 + 4) and PSA is (6.39). Prostate volume: 51 grams.  RAFORD BRISSETT reports an increasing PSA over the past three years with mild to moderate LUTS but, without hematuria/dysuria. Requested his PCP, Dr. Rodman Pickle, refer him to a urologist  Biopsies of prostate (if applicable) revealed:    Past/Anticipated interventions by urology, if any: prostate biopsy, discussion about treatment options, referral to Dr. Tammi Klippel to discuss IMRT vs. brachytherapy  Past/Anticipated interventions by medical oncology, if any: no  Weight changes, if any: no  Bowel/Bladder complaints, if any: Reports ED, occasional mild dysuria and occasional night sweats. IPSS 6. Denies hematuria or leakage.  Nausea/Vomiting, if any: no  Pain issues, if any:  Minor low back "small of back" pain that doesn't require intervention with medication.  SAFETY ISSUES:  Prior radiation? no  Pacemaker/ICD? no  Possible current pregnancy? no  Is the patient on methotrexate? no  Current Complaints / other details:  71 year old male. Divorced. Has two sons (age 58 and 33). Reports he did a tour in [place]. Denies any family history of cancer. Reports his brother have no prostate issues neither did his father.

## 2016-06-22 ENCOUNTER — Encounter: Payer: Self-pay | Admitting: Radiation Oncology

## 2016-06-22 ENCOUNTER — Ambulatory Visit
Admission: RE | Admit: 2016-06-22 | Discharge: 2016-06-22 | Disposition: A | Discharge status: Home / Self Care | Payer: Medicare Other | Source: Ambulatory Visit | Attending: Radiation Oncology | Admitting: Radiation Oncology

## 2016-06-22 DIAGNOSIS — M1990 Unspecified osteoarthritis, unspecified site: Secondary | ICD-10-CM | POA: Diagnosis not present

## 2016-06-22 DIAGNOSIS — R350 Frequency of micturition: Secondary | ICD-10-CM | POA: Insufficient documentation

## 2016-06-22 DIAGNOSIS — K219 Gastro-esophageal reflux disease without esophagitis: Secondary | ICD-10-CM | POA: Insufficient documentation

## 2016-06-22 DIAGNOSIS — Z86010 Personal history of colonic polyps: Secondary | ICD-10-CM | POA: Insufficient documentation

## 2016-06-22 DIAGNOSIS — C61 Malignant neoplasm of prostate: Secondary | ICD-10-CM | POA: Insufficient documentation

## 2016-06-22 DIAGNOSIS — Z7982 Long term (current) use of aspirin: Secondary | ICD-10-CM | POA: Insufficient documentation

## 2016-06-22 DIAGNOSIS — Z9889 Other specified postprocedural states: Secondary | ICD-10-CM | POA: Diagnosis not present

## 2016-06-22 DIAGNOSIS — F329 Major depressive disorder, single episode, unspecified: Secondary | ICD-10-CM | POA: Insufficient documentation

## 2016-06-22 DIAGNOSIS — I10 Essential (primary) hypertension: Secondary | ICD-10-CM | POA: Insufficient documentation

## 2016-06-22 DIAGNOSIS — F419 Anxiety disorder, unspecified: Secondary | ICD-10-CM | POA: Diagnosis not present

## 2016-06-22 DIAGNOSIS — F17210 Nicotine dependence, cigarettes, uncomplicated: Secondary | ICD-10-CM | POA: Diagnosis not present

## 2016-06-22 HISTORY — DX: Malignant neoplasm of prostate: C61

## 2016-06-22 NOTE — Progress Notes (Signed)
Radiation Oncology         [phone] ________________________________  Initial outpatient Consultation  Name: Brady Rodriguez MRN: [id number]  Date: 06/22/2016  DOB: 1945-12-27  CC:Brady Limbo, MD  Brady Ingles Candee Furbish, MD   REFERRING PHYSICIAN: Alyson Ingles Candee Furbish, MD  DIAGNOSIS: The encounter diagnosis was Malignant neoplasm of prostate [facility]).    ICD-9-CM ICD-10-CM   1. Malignant neoplasm of prostate ([place]) 20 C61    71 y.o. gentleman with Stage T1c adenocarcinoma of the prostate with Gleason Score of 3+4, and PSA of 6.39  HISTORY OF PRESENT ILLNESS: Brady Rodriguez is a 71 y.o. male with a diagnosis of prostate cancer. He was noted to have an elevated PSA of 6.39 by his primary care physician, Dr. Coletta Rodriguez.  Accordingly, he was referred for evaluation in urology by Dr. Alyson Ingles on 04/19/2016,  digital rectal examination was performed at that time revealing a firm right lobe.  The patient proceeded to transrectal ultrasound with 12 biopsies of the prostate on 06/02/2016.  The prostate volume measured 51 cc.  Out of 12 core biopsies,7 were positive.  The maximum Gleason score was 3+4, and this was seen in right apex.  He was also noted to have 3+3 disease in the left lateral base, right lateral base, left and right mid gland, left apex and left apex lateral.  The patient reviewed the biopsy results with his urologist and he has kindly been referred today for discussion of potential radiation treatment options.   PREVIOUS RADIATION THERAPY: No  PAST MEDICAL HISTORY:  Past Medical History:  Diagnosis Date  . Anxiety   . Arthritis   . Depression   . Dyspnea    with exertion  . GERD (gastroesophageal reflux disease)   . Hypertension   . Personal history of colonic polyps 02/04/2002   hyperplastic  . Prostate cancer ([facility])   . Tobacco use disorder   . Urinary frequency   . Wears glasses       PAST SURGICAL HISTORY: Past Surgical History:  Procedure Laterality Date  .  COLONOSCOPY    . HEMORRHOID SURGERY    . INGUINAL HERNIA REPAIR     left  . INGUINAL HERNIA REPAIR Right   . MASS EXCISION N/A 12/31/2013   Procedure: EXCISION OF SCALP MASS;  Surgeon: Coralie Keens, MD;  Location: [facility];  Service: General;  Laterality: N/A;  . PARATHYROIDECTOMY N/A 12/28/2015   Procedure: PARATHYROIDECTOMY;  Surgeon: Coralie Keens, MD;  Location: [place];  Service: General;  Laterality: N/A;  . POLYPECTOMY      FAMILY HISTORY:  Family History  Problem Relation Age of Onset  . Colon cancer Neg Hx   . Esophageal cancer Neg Hx   . Stomach cancer Neg Hx   . Cancer Neg Hx     SOCIAL HISTORY:  Social History   Social History  . Marital status: Divorced    Spouse name: N/A  . Number of children: 2  . Years of education: N/A   Occupational History  . retired [employer]   Social History Main Topics  . Smoking status: Current Every Day Smoker    Packs/day: 0.25    Years: 50.00    Types: Cigarettes  . Smokeless tobacco: Never Used     Comment: "3-4 cigarettes a day"  . Alcohol use 3.0 oz/week    6 Standard drinks or equivalent per week     Comment: socially  . Drug use: No  . Sexual activity:  Yes   Other Topics Concern  . Not on file   Social History Narrative  . No narrative on file    ALLERGIES: Patient has no known allergies.  MEDICATIONS:  Current Outpatient Prescriptions  Medication Sig Dispense Refill  . amLODipine (NORVASC) 10 MG tablet Take 10 mg by mouth daily.    Marland Kitchen aspirin EC 81 MG tablet Take 81 mg by mouth daily.    . calcium carbonate (OS-CAL) 1250 (500 Ca) MG chewable tablet Chew 2 tablets (2,500 mg total) by mouth 2 (two) times daily. 60 tablet 0  . omeprazole (PRILOSEC) 20 MG capsule Take 20 mg by mouth daily.    Marland Kitchen PARoxetine (PAXIL) 40 MG tablet Take 40 mg by mouth every morning.    . prazosin (MINIPRESS) 5 MG capsule Take 5-10 mg by mouth See admin instructions. Take 5 mg by mouth in the morning and  take 10 mg by mouth at bedtime     No current facility-administered medications for this encounter.     REVIEW OF SYSTEMS:  On review of systems, the patient reports that he is doing well overall. He denies any chest pain, shortness of breath, cough, fevers, chills, or  unintended weight changes. He denies any bowel disturbances, and denies abdominal pain, nausea or vomiting. He denies any new musculoskeletal or joint aches or pains, but reports mild lower back pain that doesn't require medication. His IPSS was 6, indicating mild urinary symptoms. He has occasional mild dysuria and occasional night sweats. He is able to complete sexual activity with most attempts. A complete review of systems is obtained and is otherwise negative.    PHYSICAL EXAM:  Wt Readings from Last 3 Encounters:  06/22/16 151 lb 3.2 oz (68.6 kg)  12/28/15 140 lb 8 oz (63.7 kg)  12/23/15 140 lb 8 oz (63.7 kg)   Temp Readings from Last 3 Encounters:  06/22/16 98 F (36.7 C) (Oral)  12/29/15 99.8 F (37.7 C) (Oral)  12/23/15 97.9 F (36.6 C)   BP Readings from Last 3 Encounters:  06/22/16 133/89  03/28/16 113/63  12/29/15 127/67   Pulse Readings from Last 3 Encounters:  06/22/16 86  03/28/16 (!) 101  12/29/15 82   Pain Assessment Pain Score: 0-No pain/10  In general this is a well appearing African-American male in no acute distress. He is alert and oriented x4 and appropriate throughout the examination. HEENT reveals that the patient is normocephalic, atraumatic. EOMs are intact. PERRLA. Skin is intact without any evidence of gross lesions. Cardiovascular exam reveals a regular rate and rhythm, no clicks rubs or murmurs are auscultated. Chest is clear to auscultation bilaterally. Lymphatic assessment is performed and does not reveal any adenopathy in the cervical, supraclavicular, axillary, or inguinal chains. Abdomen has active bowel sounds in all quadrants and is intact. The abdomen is soft, non tender, non  distended. Lower extremities are negative for pretibial pitting edema, deep calf tenderness, cyanosis or clubbing.   KPS = 100  100 - Normal; no complaints; no evidence of disease. 90   - Able to carry on normal activity; minor signs or symptoms of disease. 80   - Normal activity with effort; some signs or symptoms of disease. 40   - Cares for self; unable to carry on normal activity or to do active work. 60   - Requires occasional assistance, but is able to care for most of his personal needs. 50   - Requires considerable assistance and frequent medical care. 40   -  Disabled; requires special care and assistance. 10   - Severely disabled; hospital admission is indicated although death not imminent. 64   - Very sick; hospital admission necessary; active supportive treatment necessary. 10   - Moribund; fatal processes progressing rapidly. 0     - Dead  Karnofsky DA, Abelmann Wilder, Craver LS and Burchenal [facility] [phone]) The use of the nitrogen mustards in the palliative treatment of carcinoma: with particular reference to bronchogenic carcinoma Cancer 1 634-56  LABORATORY DATA:  Lab Results  Component Value Date   WBC 9.8 12/23/2015   HGB 14.1 12/23/2015   HCT 42.9 12/23/2015   MCV 93.3 12/23/2015   PLT 221 12/23/2015   Lab Results  Component Value Date   NA 138 12/29/2015   K 3.8 12/29/2015   CL 109 12/29/2015   CO2 26 12/29/2015   No results found for: ALT, AST, GGT, ALKPHOS, BILITOT   RADIOGRAPHY: No results found.    IMPRESSION/PLAN: 1. 71 y.o. gentleman with Stage T2a adenocarcinoma of the prostate with Gleason Score of 3+4, and PSA of 6.39  Today, we reviewed the findings and workup thus far.  We discussed the natural history of prostate cancer.  We reviewed the the implications of T-stage, Gleason's Score, and PSA on decision-making and outcomes in prostate cancer.  We discussed radiation treatment in the management of prostate cancer with regard to the logistics and delivery  of external beam radiation treatment as well as the logistics and delivery of prostate brachytherapy.  We compared and contrasted each of these approaches and also compared these against prostatectomy.  The patient expressed interest in external beam radiotherapy.  The patient would like to proceed with prostate IMRT.  we will share our findings with Dr. Alyson Ingles and move forward with scheduling placement of three gold fiducial markers into the prostate to proceed with IMRT in the near future.     I enjoyed meeting with him today, and will look forward to participating in the care of this very nice gentleman.    Nicholos Johns, PA-C    Tyler Pita, MD  [facility] Direct Dial: [phone]  Fax: [phone] [email or web address]  Skype  [email or web address]  This document serves as a record of services personally performed by Freeman Caldron, PA-C and Tyler Pita, MD. It was created on their behalf by Darcus Austin, a trained medical scribe. The creation of this record is based on the scribe's personal observations and the providers' statements to them. This document has been checked and approved by the attending provider.

## 2016-06-22 NOTE — Progress Notes (Signed)
See progress note under physician encounter. 

## 2016-06-28 ENCOUNTER — Telehealth: Payer: Self-pay | Admitting: Medical Oncology

## 2016-06-28 NOTE — Progress Notes (Signed)
Left a message to introduce myself as the nurse navigator. I was unable to meet him the day he consulted with Dr. Tammi Klippel. I asked him to call me with questions or concerns. I will continue to follow.

## 2016-10-10 ENCOUNTER — Encounter: Payer: Self-pay | Admitting: Radiation Oncology

## 2016-10-26 NOTE — Progress Notes (Addendum)
   DIAGNOSIS: The encounter diagnosis was Malignant neoplasm of prostate ([place])  FUN  Gold seed will be placed on 11/15/2016, discussion with Radiation oncologist. GU Location of Tumor / Histology: prostatic adenocarcinoma  If Prostate Cancer, Gleason Score is (3 +4 and PSA is (6.39) Prostate  Volume : 51 grams  Chriss Czar reports an increasing PSA over the past three years with mild to moderate LUTS but, without hematuria/dysuria. Requested his PCP, Dr. Rodman Pickle, refer him to a urologist      []  Biopsies of prostate (if applicable) revealed:               Past/Anticipated interventions by urology, if any: Gold seed placement scheduled for 11-15-16 Dr. Alyson Ingles  Past/Anticipated interventions by medical oncology, if any: No  Weight changes, if any:  Wt Readings from Last 3 Encounters:  10/27/16 149 lb 9.6 oz (67.9 kg)  06/22/16 151 lb 3.2 oz (68.6 kg)  12/28/15 140 lb 8 oz (63.7 kg)    Bowel/Bladder complaints, if any: Having constipation,voiding without any issues  Nausea/Vomiting, if any: No  Pain issues, if any: No   SAFETY ISSUES:  Prior radiation? No  Pacemaker/ICD? No  Possible current pregnancy? NoIs the patient on methotrexate? No  Current Complaints / other details: 71 year old male. Divorced. Has two sons (age 78 and 40). Reports he did a tour in [place]. Denies any family history of cancer. Reports his brother have no prostate issues neither did his father. BP 133/86   Pulse 74   Temp 97.9 F (36.6 C) (Oral)   Resp 18   Ht 5\' 11"  (1.803 m)   Wt 149 lb 9.6 oz (67.9 kg)   SpO2 100%   BMI 20.86 kg/m

## 2016-10-26 NOTE — Progress Notes (Signed)
Radiation Oncology         [phone] ________________________________  Follow up New office visit  Name: Brady Rodriguez MRN: [id number]  Date: 10/27/2016  DOB: Mar 19, 1945  CC:Bernerd Limbo, MD  McKenzie, Candee Furbish, MD   REFERRING PHYSICIAN: Cleon Gustin, MD  DIAGNOSIS: 71 y.o. gentleman with Stage T1c adenocarcinoma of the prostate with Gleason Score of 3+4, and PSA of 6.39    ICD-10-CM   1. Malignant neoplasm of prostate ([place]) C61      HISTORY OF PRESENT ILLNESS: Brady Rodriguez is a 71 y.o. male with a diagnosis of prostate cancer. He was noted to have an elevated PSA of 6.39 by his primary care physician, Dr. Coletta Memos.  Accordingly, he was referred for evaluation in urology by Dr. Alyson Ingles on 04/19/2016,  digital rectal examination was performed at that time revealing a firm right lobe.  The patient proceeded to transrectal ultrasound with 12 biopsies of the prostate on 06/02/2016.  The prostate volume measured 51 cc.  Out of 12 core biopsies,7 were positive.  The maximum Gleason score was 3+4, and this was seen in right apex.  He was also noted to have 3+3 disease in the left lateral base, right lateral base, left and right mid gland, left apex and left apex lateral.  The patient reviewed the biopsy results with his urologist and was kindly referred for discussion of potential radiation treatment options on 06/22/16.  He was undecided at the time of our initial visit in May but was strongly leaning towards prostate IMRT.  Unfortunately, he did not follow up with Dr. Alyson Ingles to finalize his decision and discuss gold fiducial marker placement until August 2018.  He is now scheduled for fiducial marker placement on 11/15/16 and presents back today to review the role of radiotherapy in the management of his disease.  He has not had a repeat PSA, to our knowledge, since 03/2016.    PREVIOUS RADIATION THERAPY: No  PAST MEDICAL HISTORY:  Past Medical History:  Diagnosis Date  .  Anxiety   . Arthritis   . Depression   . Dyspnea    with exertion  . GERD (gastroesophageal reflux disease)   . Hypertension   . Personal history of colonic polyps 02/04/2002   hyperplastic  . Prostate cancer ([place])   . Tobacco use disorder   . Urinary frequency   . Wears glasses       PAST SURGICAL HISTORY: Past Surgical History:  Procedure Laterality Date  . COLONOSCOPY    . HEMORRHOID SURGERY    . INGUINAL HERNIA REPAIR     left  . INGUINAL HERNIA REPAIR Right   . MASS EXCISION N/A 12/31/2013   Procedure: EXCISION OF SCALP MASS;  Surgeon: Coralie Keens, MD;  Location: [facility];  Service: General;  Laterality: N/A;  . PARATHYROIDECTOMY N/A 12/28/2015   Procedure: PARATHYROIDECTOMY;  Surgeon: Coralie Keens, MD;  Location: [place];  Service: General;  Laterality: N/A;  . POLYPECTOMY      FAMILY HISTORY:  Family History  Problem Relation Age of Onset  . Colon cancer Neg Hx   . Esophageal cancer Neg Hx   . Stomach cancer Neg Hx   . Cancer Neg Hx     SOCIAL HISTORY:  Social History   Social History  . Marital status: Divorced    Spouse name: N/A  . Number of children: 2  . Years of education: N/A   Occupational History  . retired [employer]  Social History Main Topics  . Smoking status: Current Every Day Smoker    Packs/day: 0.25    Years: 50.00    Types: Cigarettes  . Smokeless tobacco: Never Used     Comment: "3-4 cigarettes a day"  . Alcohol use 3.0 oz/week    6 Standard drinks or equivalent per week     Comment: socially  . Drug use: No  . Sexual activity: Yes   Other Topics Concern  . Not on file   Social History Narrative  . No narrative on file    ALLERGIES: Patient has no known allergies.  MEDICATIONS:  Current Outpatient Prescriptions  Medication Sig Dispense Refill  . amLODipine (NORVASC) 10 MG tablet Take 10 mg by mouth daily.    . calcium carbonate (OS-CAL) 1250 (500 Ca) MG chewable tablet Chew 2 tablets  (2,500 mg total) by mouth 2 (two) times daily. 60 tablet 0  . omeprazole (PRILOSEC) 20 MG capsule Take 20 mg by mouth daily.    Marland Kitchen PARoxetine (PAXIL) 40 MG tablet Take 40 mg by mouth every morning.    . prazosin (MINIPRESS) 5 MG capsule Take 5-10 mg by mouth See admin instructions. Take 5 mg by mouth in the morning and take 10 mg by mouth at bedtime    . aspirin EC 81 MG tablet Take 81 mg by mouth daily.     No current facility-administered medications for this encounter.     REVIEW OF SYSTEMS:  On review of systems, the patient reports that he is doing well overall. He denies any chest pain, shortness of breath, cough, fevers, chills, or  unintended weight changes. He denies any bowel disturbances, and denies abdominal pain, nausea or vomiting. He denies any new musculoskeletal or joint aches or pains, but reports mild lower back pain that doesn't require medication. His IPSS was 7, indicating mild urinary symptoms. He has occasional mild dysuria and occasional night sweats. He is able to complete sexual activity with approximately half of attempts. SHIM score is 15.  A complete review of systems is obtained and is otherwise negative.    PHYSICAL EXAM:  Wt Readings from Last 3 Encounters:  10/27/16 149 lb 9.6 oz (67.9 kg)  06/22/16 151 lb 3.2 oz (68.6 kg)  12/28/15 140 lb 8 oz (63.7 kg)   Temp Readings from Last 3 Encounters:  10/27/16 97.9 F (36.6 C) (Oral)  06/22/16 98 F (36.7 C) (Oral)  12/29/15 99.8 F (37.7 C) (Oral)   BP Readings from Last 3 Encounters:  10/27/16 133/86  06/22/16 133/89  03/28/16 113/63   Pulse Readings from Last 3 Encounters:  10/27/16 74  06/22/16 86  03/28/16 (!) 101   Pain Assessment Pain Score: 0-No pain/10  In general this is a well appearing African-American male in no acute distress. He is alert and oriented x4 and appropriate throughout the examination. HEENT reveals that the patient is normocephalic, atraumatic. EOMs are intact. PERRLA. Skin  is intact without any evidence of gross lesions. Cardiovascular exam reveals a regular rate and rhythm, no clicks rubs or murmurs are auscultated. Chest is clear to auscultation bilaterally. Lymphatic assessment is performed and does not reveal any adenopathy in the cervical, supraclavicular, axillary, or inguinal chains. Abdomen has active bowel sounds in all quadrants and is intact. The abdomen is soft, non tender, non distended. Lower extremities are negative for pretibial pitting edema, deep calf tenderness, cyanosis or clubbing.  KPS = 100  100 - Normal; no complaints; no evidence of disease.  90   - Able to carry on normal activity; minor signs or symptoms of disease. 80   - Normal activity with effort; some signs or symptoms of disease. 15   - Cares for self; unable to carry on normal activity or to do active work. 60   - Requires occasional assistance, but is able to care for most of his personal needs. 50   - Requires considerable assistance and frequent medical care. 39   - Disabled; requires special care and assistance. 23   - Severely disabled; hospital admission is indicated although death not imminent. 38   - Very sick; hospital admission necessary; active supportive treatment necessary. 10   - Moribund; fatal processes progressing rapidly. 0     - Dead  Karnofsky DA, [place], Craver LS and Burchenal [facility] [phone]) The use of the nitrogen mustards in the palliative treatment of carcinoma: with particular reference to bronchogenic carcinoma Cancer 1 634-56  LABORATORY DATA:  Lab Results  Component Value Date   WBC 9.8 12/23/2015   HGB 14.1 12/23/2015   HCT 42.9 12/23/2015   MCV 93.3 12/23/2015   PLT 221 12/23/2015   Lab Results  Component Value Date   NA 138 12/29/2015   K 3.8 12/29/2015   CL 109 12/29/2015   CO2 26 12/29/2015   No results found for: ALT, AST, GGT, ALKPHOS, BILITOT   RADIOGRAPHY: No results found.    IMPRESSION/PLAN: 1. 71 y.o. gentleman with Stage  T2a adenocarcinoma of the prostate with Gleason Score of 3+4, and PSA of 6.39  Today, we reviewed the findings and workup thus far.  We discussed the natural history of prostate cancer and reviewed the implications of T-stage, Gleason's Score, and PSA on decision-making and outcomes in prostate cancer.  We reviewed radiation treatment in the management of prostate cancer with regard to the logistics and delivery of external beam radiation treatment. The patient remains interested in external beam radiotherapy.  At the end of our conversation, the patient would like to proceed with prostate IMRT.  He is scheduled for placement of three gold fiducial markers into the prostate with Dr. Alyson Ingles on Tuesday, Oct. 02, 2018.  He is scheduled for a CT Simulation planning visit on Friday, Oct. 5th, 2018 at 9am in preparation to begin prostate IMRT in the near future.   We will share this information with Dr. Alyson Ingles and inquire about repeating a PSA at the time of his office visit for fiducial marker placement so that we can be aware of any potential significant changes in his PSA since the time of his initial consult.  We enjoyed meeting with him today, and look forward to participating in the care of this very nice gentleman.    Nicholos Johns, PA-C    Tyler Pita, MD  [facility] Direct Dial: [phone]  Fax: [phone] [email or web address]  Skype  LinkedIn

## 2016-10-27 ENCOUNTER — Ambulatory Visit
Admission: RE | Admit: 2016-10-27 | Discharge: 2016-10-27 | Disposition: A | Discharge status: Home / Self Care | Payer: Medicare Other | Source: Ambulatory Visit | Attending: Radiation Oncology | Admitting: Radiation Oncology

## 2016-10-27 ENCOUNTER — Encounter: Payer: Self-pay | Admitting: Urology

## 2016-10-27 VITALS — BP 133/86 | HR 74 | Temp 97.9°F | Resp 18 | Ht 71.0 in | Wt 149.6 lb

## 2016-10-27 DIAGNOSIS — M1990 Unspecified osteoarthritis, unspecified site: Secondary | ICD-10-CM | POA: Insufficient documentation

## 2016-10-27 DIAGNOSIS — F17210 Nicotine dependence, cigarettes, uncomplicated: Secondary | ICD-10-CM | POA: Insufficient documentation

## 2016-10-27 DIAGNOSIS — F419 Anxiety disorder, unspecified: Secondary | ICD-10-CM | POA: Insufficient documentation

## 2016-10-27 DIAGNOSIS — K219 Gastro-esophageal reflux disease without esophagitis: Secondary | ICD-10-CM | POA: Insufficient documentation

## 2016-10-27 DIAGNOSIS — I10 Essential (primary) hypertension: Secondary | ICD-10-CM | POA: Diagnosis not present

## 2016-10-27 DIAGNOSIS — C61 Malignant neoplasm of prostate: Secondary | ICD-10-CM

## 2016-10-27 DIAGNOSIS — R350 Frequency of micturition: Secondary | ICD-10-CM | POA: Insufficient documentation

## 2016-10-27 DIAGNOSIS — Z7982 Long term (current) use of aspirin: Secondary | ICD-10-CM | POA: Diagnosis not present

## 2016-10-27 DIAGNOSIS — F329 Major depressive disorder, single episode, unspecified: Secondary | ICD-10-CM | POA: Diagnosis not present

## 2016-10-27 DIAGNOSIS — Z9889 Other specified postprocedural states: Secondary | ICD-10-CM | POA: Insufficient documentation

## 2016-10-27 DIAGNOSIS — Z86010 Personal history of colonic polyps: Secondary | ICD-10-CM | POA: Diagnosis not present

## 2016-11-18 ENCOUNTER — Ambulatory Visit: Admission: RE | Admit: 2016-11-18 | Payer: Medicare Other | Source: Ambulatory Visit | Admitting: Radiation Oncology

## 2016-11-24 ENCOUNTER — Encounter: Payer: Self-pay | Admitting: Medical Oncology

## 2016-11-24 ENCOUNTER — Telehealth: Payer: Self-pay | Admitting: Urology

## 2016-11-24 ENCOUNTER — Ambulatory Visit
Admission: RE | Admit: 2016-11-24 | Discharge: 2016-11-24 | Disposition: A | Discharge status: Home / Self Care | Payer: Medicare Other | Source: Ambulatory Visit | Attending: Radiation Oncology | Admitting: Radiation Oncology

## 2016-11-24 DIAGNOSIS — C61 Malignant neoplasm of prostate: Secondary | ICD-10-CM

## 2016-11-24 NOTE — Telephone Encounter (Signed)
I spoke with Dr. Noland Fordyce nurse, Anderson Malta, regarding recent PSA results for Mr. Renault. His PSA was repeated on 11/15/2016 and was noted to be 6.75 which is slightly increased from 6.39 from February 2018. I called Mr. Jewkes to inform him of this information. He was quite Patent attorney. We will see him back as planned to start is radiotherapy on 12/05/2016.  He knows to call with any questions or concerns in the interim.   Nicholos Johns, PA-C

## 2016-11-24 NOTE — Progress Notes (Signed)
  Radiation Oncology         [phone] ________________________________  Name: Brady Rodriguez MRN: [id number]  Date: 11/24/2016  DOB: [birth year]  SIMULATION AND TREATMENT PLANNING NOTE    ICD-10-CM   1. Malignant neoplasm of prostate ([place]) C61     DIAGNOSIS:  71 y.o. gentleman with with Stage T1c adenocarcinoma of the prostate with Gleason Score of 3+4, and PSA of 6.39.  NARRATIVE:  The patient was brought to the [facility].  Identity was confirmed.  All relevant records and images related to the planned course of therapy were reviewed.  The patient freely provided informed written consent to proceed with treatment after reviewing the details related to the planned course of therapy. The consent form was witnessed and verified by the simulation staff.  Then, the patient was set-up in a stable reproducible supine position for radiation therapy.  A vacuum lock pillow device was custom fabricated to position his legs in a reproducible immobilized position.  Then, I performed a urethrogram under sterile conditions to identify the prostatic apex.  CT images were obtained.  Surface markings were placed.  The CT images were loaded into the planning software.  Then the prostate target and avoidance structures including the rectum, bladder, bowel and hips were contoured.  Treatment planning then occurred.  The radiation prescription was entered and confirmed.  A total of one complex treatment devices was fabricated. I have requested : Intensity Modulated Radiotherapy (IMRT) is medically necessary for this case for the following reason:  Rectal sparing.Marland Kitchen  PLAN:  The patient will receive 78 Gy in 40 fractions.  ________________________________  Sheral Apley Tammi Klippel, M.D.  This document serves as a record of services personally performed by Tyler Pita MD. It was created on his behalf by Delton Coombes, a trained medical scribe. The creation of this record is based on the scribe's  personal observations and the provider's statements to them. This document has been checked and approved by the attending provider.

## 2016-12-02 DIAGNOSIS — C61 Malignant neoplasm of prostate: Secondary | ICD-10-CM | POA: Diagnosis not present

## 2016-12-05 ENCOUNTER — Ambulatory Visit
Admission: RE | Admit: 2016-12-05 | Discharge: 2016-12-05 | Disposition: A | Discharge status: Home / Self Care | Payer: Medicare Other | Source: Ambulatory Visit | Attending: Radiation Oncology | Admitting: Radiation Oncology

## 2016-12-05 ENCOUNTER — Encounter: Payer: Self-pay | Admitting: Medical Oncology

## 2016-12-05 DIAGNOSIS — C61 Malignant neoplasm of prostate: Secondary | ICD-10-CM | POA: Diagnosis not present

## 2016-12-06 ENCOUNTER — Ambulatory Visit
Admission: RE | Admit: 2016-12-06 | Discharge: 2016-12-06 | Disposition: A | Discharge status: Home / Self Care | Payer: Medicare Other | Source: Ambulatory Visit | Attending: Radiation Oncology | Admitting: Radiation Oncology

## 2016-12-06 DIAGNOSIS — C61 Malignant neoplasm of prostate: Secondary | ICD-10-CM | POA: Diagnosis not present

## 2016-12-07 ENCOUNTER — Ambulatory Visit
Admission: RE | Admit: 2016-12-07 | Discharge: 2016-12-07 | Disposition: A | Discharge status: Home / Self Care | Payer: Medicare Other | Source: Ambulatory Visit | Attending: Radiation Oncology | Admitting: Radiation Oncology

## 2016-12-07 DIAGNOSIS — C61 Malignant neoplasm of prostate: Secondary | ICD-10-CM | POA: Diagnosis not present

## 2016-12-08 ENCOUNTER — Ambulatory Visit
Admission: RE | Admit: 2016-12-08 | Discharge: 2016-12-08 | Disposition: A | Discharge status: Home / Self Care | Payer: Medicare Other | Source: Ambulatory Visit | Attending: Radiation Oncology | Admitting: Radiation Oncology

## 2016-12-08 DIAGNOSIS — C61 Malignant neoplasm of prostate: Secondary | ICD-10-CM | POA: Diagnosis not present

## 2016-12-09 ENCOUNTER — Ambulatory Visit
Admission: RE | Admit: 2016-12-09 | Discharge: 2016-12-09 | Disposition: A | Discharge status: Home / Self Care | Payer: Medicare Other | Source: Ambulatory Visit | Attending: Radiation Oncology | Admitting: Radiation Oncology

## 2016-12-09 DIAGNOSIS — C61 Malignant neoplasm of prostate: Secondary | ICD-10-CM | POA: Diagnosis not present

## 2016-12-12 ENCOUNTER — Ambulatory Visit
Admission: RE | Admit: 2016-12-12 | Discharge: 2016-12-12 | Disposition: A | Discharge status: Home / Self Care | Payer: Medicare Other | Source: Ambulatory Visit | Attending: Radiation Oncology | Admitting: Radiation Oncology

## 2016-12-12 DIAGNOSIS — C61 Malignant neoplasm of prostate: Secondary | ICD-10-CM | POA: Diagnosis not present

## 2016-12-13 ENCOUNTER — Ambulatory Visit
Admission: RE | Admit: 2016-12-13 | Discharge: 2016-12-13 | Disposition: A | Discharge status: Home / Self Care | Payer: Medicare Other | Source: Ambulatory Visit | Attending: Radiation Oncology | Admitting: Radiation Oncology

## 2016-12-13 DIAGNOSIS — C61 Malignant neoplasm of prostate: Secondary | ICD-10-CM | POA: Diagnosis not present

## 2016-12-14 ENCOUNTER — Ambulatory Visit
Admission: RE | Admit: 2016-12-14 | Discharge: 2016-12-14 | Disposition: A | Discharge status: Home / Self Care | Payer: Medicare Other | Source: Ambulatory Visit | Attending: Radiation Oncology | Admitting: Radiation Oncology

## 2016-12-14 DIAGNOSIS — C61 Malignant neoplasm of prostate: Secondary | ICD-10-CM | POA: Diagnosis not present

## 2016-12-15 ENCOUNTER — Ambulatory Visit
Admission: RE | Admit: 2016-12-15 | Discharge: 2016-12-15 | Disposition: A | Discharge status: Home / Self Care | Payer: Medicare Other | Source: Ambulatory Visit | Attending: Radiation Oncology | Admitting: Radiation Oncology

## 2016-12-15 DIAGNOSIS — C61 Malignant neoplasm of prostate: Secondary | ICD-10-CM | POA: Diagnosis not present

## 2016-12-16 ENCOUNTER — Ambulatory Visit
Admission: RE | Admit: 2016-12-16 | Discharge: 2016-12-16 | Disposition: A | Discharge status: Home / Self Care | Payer: Medicare Other | Source: Ambulatory Visit | Attending: Radiation Oncology | Admitting: Radiation Oncology

## 2016-12-16 DIAGNOSIS — C61 Malignant neoplasm of prostate: Secondary | ICD-10-CM | POA: Diagnosis not present

## 2016-12-19 ENCOUNTER — Ambulatory Visit
Admission: RE | Admit: 2016-12-19 | Discharge: 2016-12-19 | Disposition: A | Discharge status: Home / Self Care | Payer: Medicare Other | Source: Ambulatory Visit | Attending: Radiation Oncology | Admitting: Radiation Oncology

## 2016-12-19 DIAGNOSIS — Z79899 Other long term (current) drug therapy: Secondary | ICD-10-CM | POA: Insufficient documentation

## 2016-12-19 DIAGNOSIS — C61 Malignant neoplasm of prostate: Secondary | ICD-10-CM | POA: Insufficient documentation

## 2016-12-19 DIAGNOSIS — Z7982 Long term (current) use of aspirin: Secondary | ICD-10-CM | POA: Insufficient documentation

## 2016-12-19 DIAGNOSIS — Z510 Encounter for antineoplastic radiation therapy: Secondary | ICD-10-CM | POA: Insufficient documentation

## 2016-12-20 ENCOUNTER — Ambulatory Visit
Admission: RE | Admit: 2016-12-20 | Discharge: 2016-12-20 | Disposition: A | Discharge status: Home / Self Care | Payer: Medicare Other | Source: Ambulatory Visit | Attending: Radiation Oncology | Admitting: Radiation Oncology

## 2016-12-20 DIAGNOSIS — Z510 Encounter for antineoplastic radiation therapy: Secondary | ICD-10-CM | POA: Diagnosis not present

## 2016-12-21 ENCOUNTER — Ambulatory Visit
Admission: RE | Admit: 2016-12-21 | Discharge: 2016-12-21 | Disposition: A | Discharge status: Home / Self Care | Payer: Medicare Other | Source: Ambulatory Visit | Attending: Radiation Oncology | Admitting: Radiation Oncology

## 2016-12-21 DIAGNOSIS — Z510 Encounter for antineoplastic radiation therapy: Secondary | ICD-10-CM | POA: Diagnosis not present

## 2016-12-22 ENCOUNTER — Ambulatory Visit
Admission: RE | Admit: 2016-12-22 | Discharge: 2016-12-22 | Disposition: A | Discharge status: Home / Self Care | Payer: Medicare Other | Source: Ambulatory Visit | Attending: Radiation Oncology | Admitting: Radiation Oncology

## 2016-12-22 DIAGNOSIS — Z510 Encounter for antineoplastic radiation therapy: Secondary | ICD-10-CM | POA: Diagnosis not present

## 2016-12-23 ENCOUNTER — Ambulatory Visit
Admission: RE | Admit: 2016-12-23 | Discharge: 2016-12-23 | Disposition: A | Discharge status: Home / Self Care | Payer: Medicare Other | Source: Ambulatory Visit | Attending: Radiation Oncology | Admitting: Radiation Oncology

## 2016-12-23 DIAGNOSIS — Z510 Encounter for antineoplastic radiation therapy: Secondary | ICD-10-CM | POA: Diagnosis not present

## 2016-12-26 ENCOUNTER — Ambulatory Visit
Admission: RE | Admit: 2016-12-26 | Discharge: 2016-12-26 | Disposition: A | Discharge status: Home / Self Care | Payer: Medicare Other | Source: Ambulatory Visit | Attending: Radiation Oncology | Admitting: Radiation Oncology

## 2016-12-26 DIAGNOSIS — Z510 Encounter for antineoplastic radiation therapy: Secondary | ICD-10-CM | POA: Diagnosis not present

## 2016-12-27 ENCOUNTER — Ambulatory Visit
Admission: RE | Admit: 2016-12-27 | Discharge: 2016-12-27 | Disposition: A | Discharge status: Home / Self Care | Payer: Medicare Other | Source: Ambulatory Visit | Attending: Radiation Oncology | Admitting: Radiation Oncology

## 2016-12-27 DIAGNOSIS — Z510 Encounter for antineoplastic radiation therapy: Secondary | ICD-10-CM | POA: Diagnosis not present

## 2016-12-28 ENCOUNTER — Ambulatory Visit
Admission: RE | Admit: 2016-12-28 | Discharge: 2016-12-28 | Disposition: A | Discharge status: Home / Self Care | Payer: Medicare Other | Source: Ambulatory Visit | Attending: Radiation Oncology | Admitting: Radiation Oncology

## 2016-12-28 DIAGNOSIS — Z510 Encounter for antineoplastic radiation therapy: Secondary | ICD-10-CM | POA: Diagnosis not present

## 2016-12-29 ENCOUNTER — Ambulatory Visit
Admission: RE | Admit: 2016-12-29 | Discharge: 2016-12-29 | Disposition: A | Discharge status: Home / Self Care | Payer: Medicare Other | Source: Ambulatory Visit | Attending: Radiation Oncology | Admitting: Radiation Oncology

## 2016-12-29 DIAGNOSIS — Z510 Encounter for antineoplastic radiation therapy: Secondary | ICD-10-CM | POA: Diagnosis not present

## 2016-12-30 ENCOUNTER — Ambulatory Visit
Admission: RE | Admit: 2016-12-30 | Discharge: 2016-12-30 | Disposition: A | Discharge status: Home / Self Care | Payer: Medicare Other | Source: Ambulatory Visit | Attending: Radiation Oncology | Admitting: Radiation Oncology

## 2016-12-30 DIAGNOSIS — Z510 Encounter for antineoplastic radiation therapy: Secondary | ICD-10-CM | POA: Diagnosis not present

## 2017-01-01 ENCOUNTER — Ambulatory Visit
Admission: RE | Admit: 2017-01-01 | Discharge: 2017-01-01 | Disposition: A | Discharge status: Home / Self Care | Payer: Medicare Other | Source: Ambulatory Visit | Attending: Radiation Oncology | Admitting: Radiation Oncology

## 2017-01-01 DIAGNOSIS — Z510 Encounter for antineoplastic radiation therapy: Secondary | ICD-10-CM | POA: Diagnosis not present

## 2017-01-02 ENCOUNTER — Ambulatory Visit
Admission: RE | Admit: 2017-01-02 | Discharge: 2017-01-02 | Disposition: A | Discharge status: Home / Self Care | Payer: Medicare Other | Source: Ambulatory Visit | Attending: Radiation Oncology | Admitting: Radiation Oncology

## 2017-01-02 DIAGNOSIS — Z510 Encounter for antineoplastic radiation therapy: Secondary | ICD-10-CM | POA: Diagnosis not present

## 2017-01-03 ENCOUNTER — Ambulatory Visit
Admission: RE | Admit: 2017-01-03 | Discharge: 2017-01-03 | Disposition: A | Discharge status: Home / Self Care | Payer: Medicare Other | Source: Ambulatory Visit | Attending: Radiation Oncology | Admitting: Radiation Oncology

## 2017-01-03 DIAGNOSIS — Z510 Encounter for antineoplastic radiation therapy: Secondary | ICD-10-CM | POA: Diagnosis not present

## 2017-01-04 ENCOUNTER — Ambulatory Visit
Admission: RE | Admit: 2017-01-04 | Discharge: 2017-01-04 | Disposition: A | Discharge status: Home / Self Care | Payer: Medicare Other | Source: Ambulatory Visit | Attending: Radiation Oncology | Admitting: Radiation Oncology

## 2017-01-04 DIAGNOSIS — Z510 Encounter for antineoplastic radiation therapy: Secondary | ICD-10-CM | POA: Diagnosis not present

## 2017-01-09 ENCOUNTER — Ambulatory Visit
Admission: RE | Admit: 2017-01-09 | Discharge: 2017-01-09 | Disposition: A | Discharge status: Home / Self Care | Payer: Medicare Other | Source: Ambulatory Visit | Attending: Radiation Oncology | Admitting: Radiation Oncology

## 2017-01-09 DIAGNOSIS — Z510 Encounter for antineoplastic radiation therapy: Secondary | ICD-10-CM | POA: Diagnosis not present

## 2017-01-10 ENCOUNTER — Ambulatory Visit
Admission: RE | Admit: 2017-01-10 | Discharge: 2017-01-10 | Disposition: A | Discharge status: Home / Self Care | Payer: Medicare Other | Source: Ambulatory Visit | Attending: Radiation Oncology | Admitting: Radiation Oncology

## 2017-01-10 DIAGNOSIS — Z510 Encounter for antineoplastic radiation therapy: Secondary | ICD-10-CM | POA: Diagnosis not present

## 2017-01-11 ENCOUNTER — Ambulatory Visit
Admission: RE | Admit: 2017-01-11 | Discharge: 2017-01-11 | Disposition: A | Discharge status: Home / Self Care | Payer: Medicare Other | Source: Ambulatory Visit | Attending: Radiation Oncology | Admitting: Radiation Oncology

## 2017-01-11 DIAGNOSIS — Z510 Encounter for antineoplastic radiation therapy: Secondary | ICD-10-CM | POA: Diagnosis not present

## 2017-01-12 ENCOUNTER — Ambulatory Visit
Admission: RE | Admit: 2017-01-12 | Discharge: 2017-01-12 | Disposition: A | Discharge status: Home / Self Care | Payer: Medicare Other | Source: Ambulatory Visit | Attending: Radiation Oncology | Admitting: Radiation Oncology

## 2017-01-12 DIAGNOSIS — Z510 Encounter for antineoplastic radiation therapy: Secondary | ICD-10-CM | POA: Diagnosis not present

## 2017-01-13 ENCOUNTER — Ambulatory Visit
Admission: RE | Admit: 2017-01-13 | Discharge: 2017-01-13 | Disposition: A | Discharge status: Home / Self Care | Payer: Medicare Other | Source: Ambulatory Visit | Attending: Radiation Oncology | Admitting: Radiation Oncology

## 2017-01-13 DIAGNOSIS — Z510 Encounter for antineoplastic radiation therapy: Secondary | ICD-10-CM | POA: Diagnosis not present

## 2017-01-16 ENCOUNTER — Ambulatory Visit
Admission: RE | Admit: 2017-01-16 | Discharge: 2017-01-16 | Disposition: A | Discharge status: Home / Self Care | Payer: Medicare Other | Source: Ambulatory Visit | Attending: Radiation Oncology | Admitting: Radiation Oncology

## 2017-01-16 DIAGNOSIS — Z510 Encounter for antineoplastic radiation therapy: Secondary | ICD-10-CM | POA: Diagnosis not present

## 2017-01-17 ENCOUNTER — Ambulatory Visit
Admission: RE | Admit: 2017-01-17 | Discharge: 2017-01-17 | Disposition: A | Discharge status: Home / Self Care | Payer: Medicare Other | Source: Ambulatory Visit | Attending: Radiation Oncology | Admitting: Radiation Oncology

## 2017-01-17 DIAGNOSIS — Z510 Encounter for antineoplastic radiation therapy: Secondary | ICD-10-CM | POA: Diagnosis not present

## 2017-01-18 ENCOUNTER — Ambulatory Visit
Admission: RE | Admit: 2017-01-18 | Discharge: 2017-01-18 | Disposition: A | Discharge status: Home / Self Care | Payer: Medicare Other | Source: Ambulatory Visit | Attending: Radiation Oncology | Admitting: Radiation Oncology

## 2017-01-18 DIAGNOSIS — Z510 Encounter for antineoplastic radiation therapy: Secondary | ICD-10-CM | POA: Diagnosis not present

## 2017-01-19 ENCOUNTER — Ambulatory Visit
Admission: RE | Admit: 2017-01-19 | Discharge: 2017-01-19 | Disposition: A | Discharge status: Home / Self Care | Payer: Medicare Other | Source: Ambulatory Visit | Attending: Radiation Oncology | Admitting: Radiation Oncology

## 2017-01-19 DIAGNOSIS — Z510 Encounter for antineoplastic radiation therapy: Secondary | ICD-10-CM | POA: Diagnosis not present

## 2017-01-20 ENCOUNTER — Ambulatory Visit
Admission: RE | Admit: 2017-01-20 | Discharge: 2017-01-20 | Disposition: A | Discharge status: Home / Self Care | Payer: Medicare Other | Source: Ambulatory Visit | Attending: Radiation Oncology | Admitting: Radiation Oncology

## 2017-01-20 DIAGNOSIS — Z510 Encounter for antineoplastic radiation therapy: Secondary | ICD-10-CM | POA: Diagnosis not present

## 2017-01-23 ENCOUNTER — Ambulatory Visit: Payer: Medicare Other

## 2017-01-24 ENCOUNTER — Ambulatory Visit
Admission: RE | Admit: 2017-01-24 | Discharge: 2017-01-24 | Disposition: A | Discharge status: Home / Self Care | Payer: Medicare Other | Source: Ambulatory Visit | Attending: Radiation Oncology | Admitting: Radiation Oncology

## 2017-01-24 DIAGNOSIS — Z510 Encounter for antineoplastic radiation therapy: Secondary | ICD-10-CM | POA: Diagnosis not present

## 2017-01-25 ENCOUNTER — Ambulatory Visit
Admission: RE | Admit: 2017-01-25 | Discharge: 2017-01-25 | Disposition: A | Discharge status: Home / Self Care | Payer: Medicare Other | Source: Ambulatory Visit | Attending: Radiation Oncology | Admitting: Radiation Oncology

## 2017-01-25 DIAGNOSIS — Z510 Encounter for antineoplastic radiation therapy: Secondary | ICD-10-CM | POA: Diagnosis not present

## 2017-01-26 ENCOUNTER — Ambulatory Visit
Admission: RE | Admit: 2017-01-26 | Discharge: 2017-01-26 | Disposition: A | Discharge status: Home / Self Care | Payer: Medicare Other | Source: Ambulatory Visit | Attending: Radiation Oncology | Admitting: Radiation Oncology

## 2017-01-26 DIAGNOSIS — Z510 Encounter for antineoplastic radiation therapy: Secondary | ICD-10-CM | POA: Diagnosis not present

## 2017-01-27 ENCOUNTER — Ambulatory Visit
Admission: RE | Admit: 2017-01-27 | Discharge: 2017-01-27 | Disposition: A | Discharge status: Home / Self Care | Payer: Medicare Other | Source: Ambulatory Visit | Attending: Radiation Oncology | Admitting: Radiation Oncology

## 2017-01-27 DIAGNOSIS — Z510 Encounter for antineoplastic radiation therapy: Secondary | ICD-10-CM | POA: Diagnosis not present

## 2017-01-28 ENCOUNTER — Ambulatory Visit
Admission: RE | Admit: 2017-01-28 | Discharge: 2017-01-28 | Disposition: A | Discharge status: Home / Self Care | Payer: Medicare Other | Source: Ambulatory Visit | Attending: Radiation Oncology | Admitting: Radiation Oncology

## 2017-01-28 DIAGNOSIS — Z510 Encounter for antineoplastic radiation therapy: Secondary | ICD-10-CM | POA: Diagnosis not present

## 2017-01-30 ENCOUNTER — Ambulatory Visit: Payer: Medicare Other

## 2017-01-30 ENCOUNTER — Encounter: Payer: Self-pay | Admitting: Medical Oncology

## 2017-01-30 ENCOUNTER — Ambulatory Visit
Admission: RE | Admit: 2017-01-30 | Discharge: 2017-01-30 | Disposition: A | Discharge status: Home / Self Care | Payer: Medicare Other | Source: Ambulatory Visit | Attending: Radiation Oncology | Admitting: Radiation Oncology

## 2017-01-30 ENCOUNTER — Encounter: Payer: Self-pay | Admitting: Radiation Oncology

## 2017-01-30 DIAGNOSIS — Z510 Encounter for antineoplastic radiation therapy: Secondary | ICD-10-CM | POA: Diagnosis not present

## 2017-01-31 ENCOUNTER — Ambulatory Visit: Payer: Medicare Other

## 2017-02-01 NOTE — Progress Notes (Signed)
  Radiation Oncology         [phone] ________________________________  Name: Brady Rodriguez MRN: [id number]  Date: 01/30/2017  DOB: [birth year]  End of Treatment Note  Diagnosis:   71 y.o. gentleman with Stage T1c adenocarcinoma of the prostate with Gleason Score of 3+4, and PSA of 6.39     Indication for treatment:  Curative, Definitive Radiotherapy       Radiation treatment dates:   12/05/2016 - 01/30/2017  Site/dose:   The prostate was treated to 78 Gy in 40 fractions of 1.95 Gy  Beams/energy:   The patient was treated with IMRT using volumetric arc therapy delivering 6 MV X-rays to clockwise and counterclockwise circumferential arcs with a 90 degree collimator offset to avoid dose scalloping.  Image guidance was performed with daily cone beam CT prior to each fraction to align to gold markers in the prostate and assure proper bladder and rectal fill volumes.  Immobilization was achieved with BodyFix custom mold.  Narrative: The patient tolerated radiation treatment relatively well.   The patient experienced modest fatigue and some minor urinary irritation with symptoms of urgency, leakage, nocturia x3, and dysuria. He denied hematuria. He denied any bowel issues.  Plan: The patient has completed radiation treatment. He will return to radiation oncology clinic for routine followup in one month. I advised him to call or return sooner if he has any questions or concerns related to his recovery or treatment. ________________________________  Sheral Apley. Tammi Klippel, M.D.  This document serves as a record of services personally performed by Tyler Pita, MD. It was created on his behalf by Rae Lips, a trained medical scribe. The creation of this record is based on the scribe's personal observations and the provider's statements to them. This document has been checked and approved by the attending provider.

## 2017-02-14 DIAGNOSIS — C61 Malignant neoplasm of prostate: Secondary | ICD-10-CM | POA: Diagnosis not present

## 2017-02-14 DIAGNOSIS — Z79899 Other long term (current) drug therapy: Secondary | ICD-10-CM | POA: Diagnosis not present

## 2017-02-14 DIAGNOSIS — Z510 Encounter for antineoplastic radiation therapy: Secondary | ICD-10-CM | POA: Diagnosis present

## 2017-02-14 DIAGNOSIS — Z7982 Long term (current) use of aspirin: Secondary | ICD-10-CM | POA: Diagnosis not present

## 2017-03-08 ENCOUNTER — Ambulatory Visit
Admission: RE | Admit: 2017-03-08 | Discharge: 2017-03-08 | Disposition: A | Discharge status: Home / Self Care | Payer: Medicare Other | Source: Ambulatory Visit | Attending: Urology | Admitting: Urology

## 2017-03-08 ENCOUNTER — Encounter: Payer: Self-pay | Admitting: Urology

## 2017-03-08 VITALS — BP 136/82 | HR 86 | Temp 98.3°F | Resp 18 | Ht 71.0 in | Wt 158.4 lb

## 2017-03-08 DIAGNOSIS — Z510 Encounter for antineoplastic radiation therapy: Secondary | ICD-10-CM | POA: Diagnosis not present

## 2017-03-08 DIAGNOSIS — C61 Malignant neoplasm of prostate: Secondary | ICD-10-CM

## 2017-03-08 NOTE — Progress Notes (Signed)
Radiation Oncology         (336) [phone] ________________________________  Name: Brady Rodriguez MRN: [id number]  Date: 03/08/2017  DOB: 05-25-1945  Post Treatment Note  CC: Bernerd Limbo, MD  Cleon Gustin, MD  Diagnosis:   72 y.o. gentlemanwith Stage T1c adenocarcinoma of the prostate with Gleason Score of 3+4, and PSA of 6.39  Interval Since Last Radiation:  5 weeks  12/05/2016 - 01/30/2017:  The prostate was treated to 78 Gy in 40 fractions of 1.95 Gy  Narrative:  The patient returns today for routine follow-up.   He tolerated radiation treatment relatively well with modest fatigue and some minor urinary irritation including symptoms of urgency, leakage, nocturia x3, and dysuria. He denied hematuria. He denied any bowel issues.                          On review of systems, the patient states that he is doing very well overall. His current IPSS score is 7 indicating mild LUTS including urgency and nocturia 1 per night.  He specifically denies dysuria, gross hematuria or incontinence. He reports that his bowels have returned to normal, in fact, he has resumed his stool softeners due to a tendency towards constipation. He denies abdominal pain, nausea, vomiting or diarrhea. He reports a healthy appetite and is maintaining his weight. He feels like his energy level is gradually returning.  ALLERGIES:  has No Known Allergies.  Meds: Current Outpatient Medications  Medication Sig Dispense Refill  . amLODipine (NORVASC) 10 MG tablet Take 10 mg by mouth daily.    Marland Kitchen aspirin EC 81 MG tablet Take 81 mg by mouth daily.    . calcium carbonate (OS-CAL) 1250 (500 Ca) MG chewable tablet Chew 2 tablets (2,500 mg total) by mouth 2 (two) times daily. 60 tablet 0  . omeprazole (PRILOSEC) 20 MG capsule Take 20 mg by mouth daily.    Marland Kitchen PARoxetine (PAXIL) 40 MG tablet Take 40 mg by mouth every morning.    . prazosin (MINIPRESS) 5 MG capsule Take 5-10 mg by mouth See admin instructions. Take 5 mg by  mouth in the morning and take 10 mg by mouth at bedtime     No current facility-administered medications for this encounter.     Physical Findings:  height is 5\' 11"  (1.803 m) and weight is 158 lb 6.4 oz (71.8 kg). His oral temperature is 98.3 F (36.8 C). His blood pressure is 136/82 and his pulse is 86. His respiration is 18 and oxygen saturation is 96%.  Pain Assessment Pain Score: 0-No pain/10 In general this is a well appearing African-American male in no acute distress. He's alert and oriented x4 and appropriate throughout the examination. Cardiopulmonary assessment is negative for acute distress and he exhibits normal effort.   Lab Findings: Lab Results  Component Value Date   WBC 9.8 12/23/2015   HGB 14.1 12/23/2015   HCT 42.9 12/23/2015   MCV 93.3 12/23/2015   PLT 221 12/23/2015     Radiographic Findings: No results found.  Impression/Plan: 1. 72 y.o. gentlemanwith Stage T1c adenocarcinoma of the prostate with Gleason Score of 3+4, and PSA of 6.39.   He will continue to follow up with urology for ongoing PSA determinations and has an appointment scheduled with Dr. Alyson Ingles in April 2019. He understands what to expect with regards to PSA monitoring going forward. I will look forward to following his response to treatment via correspondence with urology, and  would be happy to continue to participate in his care if clinically indicated. I talked to the patient about what to expect in the future, including his risk for erectile dysfunction and rectal bleeding. I encouraged him to call or return to the office if he has any questions regarding his previous radiation or possible radiation side effects. He was comfortable with this plan and will follow up as needed.    Nicholos Johns, PA-C

## 2017-07-02 ENCOUNTER — Inpatient Hospital Stay (HOSPITAL_COMMUNITY)
Admission: EM | Admit: 2017-07-02 | Discharge: 2017-07-06 | DRG: 123 | Disposition: A | Discharge status: Home / Self Care | Payer: Medicare Other | Attending: Student in an Organized Health Care Education/Training Program | Admitting: Student in an Organized Health Care Education/Training Program

## 2017-07-02 ENCOUNTER — Emergency Department (HOSPITAL_COMMUNITY): Payer: Medicare Other

## 2017-07-02 ENCOUNTER — Encounter (HOSPITAL_COMMUNITY): Payer: Self-pay | Admitting: Emergency Medicine

## 2017-07-02 ENCOUNTER — Other Ambulatory Visit: Payer: Self-pay

## 2017-07-02 DIAGNOSIS — Z8546 Personal history of malignant neoplasm of prostate: Secondary | ICD-10-CM | POA: Diagnosis not present

## 2017-07-02 DIAGNOSIS — Z720 Tobacco use: Secondary | ICD-10-CM | POA: Diagnosis not present

## 2017-07-02 DIAGNOSIS — Z79899 Other long term (current) drug therapy: Secondary | ICD-10-CM | POA: Diagnosis not present

## 2017-07-02 DIAGNOSIS — F329 Major depressive disorder, single episode, unspecified: Secondary | ICD-10-CM | POA: Diagnosis present

## 2017-07-02 DIAGNOSIS — F17210 Nicotine dependence, cigarettes, uncomplicated: Secondary | ICD-10-CM | POA: Diagnosis present

## 2017-07-02 DIAGNOSIS — F419 Anxiety disorder, unspecified: Secondary | ICD-10-CM | POA: Diagnosis present

## 2017-07-02 DIAGNOSIS — H469 Unspecified optic neuritis: Secondary | ICD-10-CM

## 2017-07-02 DIAGNOSIS — M1990 Unspecified osteoarthritis, unspecified site: Secondary | ICD-10-CM | POA: Diagnosis present

## 2017-07-02 DIAGNOSIS — E785 Hyperlipidemia, unspecified: Secondary | ICD-10-CM | POA: Diagnosis present

## 2017-07-02 DIAGNOSIS — I10 Essential (primary) hypertension: Secondary | ICD-10-CM | POA: Diagnosis present

## 2017-07-02 DIAGNOSIS — K219 Gastro-esophageal reflux disease without esophagitis: Secondary | ICD-10-CM | POA: Diagnosis present

## 2017-07-02 DIAGNOSIS — F339 Major depressive disorder, recurrent, unspecified: Secondary | ICD-10-CM | POA: Diagnosis not present

## 2017-07-02 DIAGNOSIS — C61 Malignant neoplasm of prostate: Secondary | ICD-10-CM | POA: Diagnosis not present

## 2017-07-02 DIAGNOSIS — Z923 Personal history of irradiation: Secondary | ICD-10-CM

## 2017-07-02 DIAGNOSIS — H468 Other optic neuritis: Secondary | ICD-10-CM | POA: Diagnosis present

## 2017-07-02 DIAGNOSIS — Z7952 Long term (current) use of systemic steroids: Secondary | ICD-10-CM | POA: Diagnosis not present

## 2017-07-02 DIAGNOSIS — D170 Benign lipomatous neoplasm of skin and subcutaneous tissue of head, face and neck: Secondary | ICD-10-CM | POA: Diagnosis not present

## 2017-07-02 DIAGNOSIS — G588 Other specified mononeuropathies: Secondary | ICD-10-CM | POA: Diagnosis not present

## 2017-07-02 LAB — CBC WITH DIFFERENTIAL/PLATELET
ABS IMMATURE GRANULOCYTES: 0.1 10*3/uL (ref 0.0–0.1)
BASOS ABS: 0 10*3/uL (ref 0.0–0.1)
BASOS PCT: 0 %
EOS ABS: 0.2 10*3/uL (ref 0.0–0.7)
Eosinophils Relative: 3 %
HCT: 45.8 % (ref 39.0–52.0)
Hemoglobin: 14.6 g/dL (ref 13.0–17.0)
IMMATURE GRANULOCYTES: 1 %
Lymphocytes Relative: 24 %
Lymphs Abs: 1.7 10*3/uL (ref 0.7–4.0)
MCH: 29.7 pg (ref 26.0–34.0)
MCHC: 31.9 g/dL (ref 30.0–36.0)
MCV: 93.3 fL (ref 78.0–100.0)
Monocytes Absolute: 0.7 10*3/uL (ref 0.1–1.0)
Monocytes Relative: 10 %
NEUTROS ABS: 4.3 10*3/uL (ref 1.7–7.7)
NEUTROS PCT: 62 %
PLATELETS: 247 10*3/uL (ref 150–400)
RBC: 4.91 MIL/uL (ref 4.22–5.81)
RDW: 12.9 % (ref 11.5–15.5)
WBC: 6.9 10*3/uL (ref 4.0–10.5)

## 2017-07-02 LAB — RAPID URINE DRUG SCREEN, HOSP PERFORMED
AMPHETAMINES: NOT DETECTED
BARBITURATES: NOT DETECTED
BENZODIAZEPINES: NOT DETECTED
COCAINE: NOT DETECTED
Opiates: NOT DETECTED
Tetrahydrocannabinol: POSITIVE — AB

## 2017-07-02 LAB — COMPREHENSIVE METABOLIC PANEL
ALBUMIN: 3.6 g/dL (ref 3.5–5.0)
ALT: 16 U/L — ABNORMAL LOW (ref 17–63)
ANION GAP: 8 (ref 5–15)
AST: 15 U/L (ref 15–41)
Alkaline Phosphatase: 46 U/L (ref 38–126)
BUN: 20 mg/dL (ref 6–20)
CHLORIDE: 106 mmol/L (ref 101–111)
CO2: 27 mmol/L (ref 22–32)
Calcium: 9.4 mg/dL (ref 8.9–10.3)
Creatinine, Ser: 1.02 mg/dL (ref 0.61–1.24)
GFR calc Af Amer: >60 mL/min (ref >60)
GFR calc non Af Amer: >60 mL/min (ref >60)
GLUCOSE: 106 mg/dL — AB (ref 65–99)
POTASSIUM: 4.1 mmol/L (ref 3.5–5.1)
Sodium: 141 mmol/L (ref 135–145)
TOTAL PROTEIN: 7 g/dL (ref 6.5–8.1)
Total Bilirubin: 0.7 mg/dL (ref 0.3–1.2)

## 2017-07-02 LAB — VITAMIN B12: Vitamin B-12: 375 pg/mL (ref 180–914)

## 2017-07-02 LAB — SEDIMENTATION RATE: SED RATE: 1 mm/h (ref 0–16)

## 2017-07-02 LAB — C-REACTIVE PROTEIN

## 2017-07-02 MED ORDER — TRAZODONE HCL 100 MG PO TABS: 100.0000 mg | ORAL_TABLET | ORAL | Status: DC | PRN

## 2017-07-02 MED ORDER — GADOBENATE DIMEGLUMINE 529 MG/ML IV SOLN
15.0000 mL | Freq: Once | INTRAVENOUS | Status: AC | PRN
  Administered 2017-07-02: 15 mL via INTRAVENOUS

## 2017-07-02 MED ORDER — AMLODIPINE BESYLATE 10 MG PO TABS
10.0000 mg | ORAL_TABLET | Freq: Every day | ORAL | Status: DC
  Administered 2017-07-02 – 2017-07-06 (×5): 10 mg via ORAL
  Filled 2017-07-02 (×3): qty 1
  Filled 2017-07-02: qty 2
  Filled 2017-07-02: qty 1

## 2017-07-02 MED ORDER — PRAZOSIN HCL 2 MG PO CAPS
10.0000 mg | ORAL_CAPSULE | Freq: Every day | ORAL | Status: DC
  Administered 2017-07-02 – 2017-07-05 (×4): 10 mg via ORAL
  Filled 2017-07-02 (×6): qty 5

## 2017-07-02 MED ORDER — SODIUM CHLORIDE 0.9 % IV SOLN
1000.0000 mg | INTRAVENOUS | Status: AC
  Administered 2017-07-02 – 2017-07-04 (×3): 1000 mg via INTRAVENOUS
  Filled 2017-07-02 (×3): qty 8

## 2017-07-02 MED ORDER — PRAZOSIN HCL 5 MG PO CAPS: 5.0000 mg | ORAL_CAPSULE | ORAL | Status: DC

## 2017-07-02 MED ORDER — SENNOSIDES-DOCUSATE SODIUM 8.6-50 MG PO TABS: 1.0000 | ORAL_TABLET | Freq: Every evening | ORAL | Status: DC | PRN

## 2017-07-02 MED ORDER — ENOXAPARIN SODIUM 40 MG/0.4ML ~~LOC~~ SOLN
40.0000 mg | SUBCUTANEOUS | Status: DC
  Administered 2017-07-02 – 2017-07-06 (×5): 40 mg via SUBCUTANEOUS
  Filled 2017-07-02 (×5): qty 0.4

## 2017-07-02 MED ORDER — PAROXETINE HCL 20 MG PO TABS
20.0000 mg | ORAL_TABLET | ORAL | Status: DC
Start: 2017-07-02 — End: 2017-07-06
  Administered 2017-07-02 – 2017-07-06 (×5): 20 mg via ORAL
  Filled 2017-07-02 (×6): qty 1

## 2017-07-02 MED ORDER — ACETAMINOPHEN 650 MG RE SUPP: 650.0000 mg | Freq: Four times a day (QID) | RECTAL | Status: DC | PRN

## 2017-07-02 MED ORDER — KETOROLAC TROMETHAMINE 15 MG/ML IJ SOLN
15.0000 mg | Freq: Four times a day (QID) | INTRAMUSCULAR | Status: DC | PRN
  Administered 2017-07-02: 15 mg via INTRAVENOUS
  Filled 2017-07-02: qty 1

## 2017-07-02 MED ORDER — ACETAMINOPHEN 325 MG PO TABS
650.0000 mg | ORAL_TABLET | Freq: Four times a day (QID) | ORAL | Status: DC | PRN
  Administered 2017-07-02: 650 mg via ORAL
  Filled 2017-07-02 (×2): qty 2

## 2017-07-02 MED ORDER — PRAZOSIN HCL 2 MG PO CAPS
5.0000 mg | ORAL_CAPSULE | Freq: Every day | ORAL | Status: DC
  Administered 2017-07-03 – 2017-07-06 (×4): 5 mg via ORAL
  Filled 2017-07-02 (×4): qty 1

## 2017-07-02 MED ORDER — PANTOPRAZOLE SODIUM 40 MG PO TBEC
40.0000 mg | DELAYED_RELEASE_TABLET | Freq: Every day | ORAL | Status: DC
  Administered 2017-07-02 – 2017-07-06 (×5): 40 mg via ORAL
  Filled 2017-07-02 (×5): qty 1

## 2017-07-02 NOTE — Consult Note (Addendum)
Referring Physician: Julianne Rice, MD    Chief Complaint: Eye Pain and Loss of Vision   HPI: Brady Rodriguez is an 73 y.o. male with a past medical history of prostate cancer, tobacco abuse, arthritis, hypertension and dyslipidemia who presents to the ED today with complaints of right eye vision loss since Thursday, 06/29/2017.  Patient states he was in his usual state of health on the 9th or 10th  May, he started having right ocular pain initially associated with ocular movement but later just a constant pain without accompanying photophobia, headaches or dizziness.  He denied vision loss at that time.  On the 14th he went to optometrist to get checked to get glasses and was sent to the ophthalmologist at [facility] eye clinic for evaluation.  After assessment they did not note any abnormalities.  Thursday, 06/28/2017 he states he woke up and had lost sight in his right eye and continued to have that same intermittent eye pain.  He presented to the [facility] on Friday and after evaluation they told him to come to the ER as they did not find anything structurally abnormal.  Did not present to the ER at that time because his ocular pain had improved and he was started to see flashes of light in his right eye.  But due to increased pain and persistent vision loss he presented to the ER today.    On admission MRI of the brain as well as MRI of the orbits revealed right perineural optic neuritis.  Neurology was consulted for evaluation  Has no other focal deficits except for right eye vision loss.  Patient denies associated disequilibrium, lightheadedness, blurred vision, spinal pain with bending, dysarthria, confusion, chest pain, shortness of breath, extremity weakness, recent infections, fevers or headache associated with the symptoms that he is had about the last several days.  He also denies a family history of autoimmune disease except for arthritis. Denies any neck pain or back pain.  Denies  having had these symptoms before.  Denies any tingling going down the spine with bending the neck.  Denies any focal tingling numbness.  Past Medical History:  Diagnosis Date  . Anxiety   . Arthritis   . Depression   . Dyspnea    with exertion  . GERD (gastroesophageal reflux disease)   . Hypertension   . Personal history of colonic polyps 02/04/2002   hyperplastic  . Prostate cancer ([place])   . Tobacco use disorder   . Urinary frequency   . Wears glasses     Past Surgical History:  Procedure Laterality Date  . COLONOSCOPY    . HEMORRHOID SURGERY    . INGUINAL HERNIA REPAIR     left  . INGUINAL HERNIA REPAIR Right   . MASS EXCISION N/A 12/31/2013   Procedure: EXCISION OF SCALP MASS;  Surgeon: Coralie Keens, MD;  Location: [facility];  Service: General;  Laterality: N/A;  . PARATHYROIDECTOMY N/A 12/28/2015   Procedure: PARATHYROIDECTOMY;  Surgeon: Coralie Keens, MD;  Location: [facility];  Service: General;  Laterality: N/A;  . POLYPECTOMY      Family History  Problem Relation Age of Onset  . Colon cancer Neg Hx   . Esophageal cancer Neg Hx   . Stomach cancer Neg Hx   . Cancer Neg Hx    Social History:  reports that he has been smoking cigarettes.  He has a 12.50 pack-year smoking history. He has never used smokeless tobacco. He reports that he  drinks about 3.0 oz of alcohol per week. He reports that he does not use drugs.  Allergies: No Known Allergies  Medications:   ROS: General ROS: negative for - chills, fatigue, fever, night sweats, weight gain or weight loss Ophthalmic ROS: Admits to vision loss and ocular pain that is intermittent denies ocular pain with movement  ENT ROS: negative for - epistaxis, nasal discharge, oral lesions, sore throat, tinnitus or vertigo Allergy and Immunology ROS: negative for - hives or itchy/watery eyes Respiratory ROS: negative for - cough, hemoptysis, shortness of breath or wheezing Cardiovascular ROS: negative for  - chest pain, dyspnea on exertion, edema or irregular heartbeat Gastrointestinal ROS: negative for - abdominal pain, diarrhea, hematemesis, nausea/vomiting or stool incontinence Genito-Urinary ROS: negative for - dysuria, hematuria, incontinence or urinary frequency/urgency Musculoskeletal ROS: negative for - joint swelling or muscular weakness Neurological ROS: as noted in HPI Dermatological ROS: negative for rash and skin lesion changes  Physical Examination: Blood pressure (!) 147/97, pulse (!) 57, temperature 98.2 F (36.8 C), temperature source Oral, resp. rate 13, height '5\' 11"'$  (1.803 m), weight 72.6 kg (160 lb), SpO2 97 %. HEENT-  Normocephalic, no lesions, without obvious abnormality.  Normal external eye and conjunctiva.   Cardiovascular-bradycardic heart rate Lungs-no increased work of breathing saturations within normal limits Musculoskeletal-no joint tenderness, deformity or swelling Skin-warm and dry,   Neurological Examination Mental Status: Alert, oriented, thought content appropriate.  Speech fluent without evidence of aphasia.  Able to follow 3 step commands without difficulty. Cranial Nerves: II: Patient only intact on the temporal side of the right eye as well as upper portion of the right eye, no blink to threat in the central portion of the in the lower portion.  No gross abnormalities with usable funduscope III,IV, VI: ptosis not present, extra-ocular motions intact bilaterally with 2 beats of nystagmus to the left on leftward gaze,.  Pupils equal, round, reactive to light on the left eye and sluggish on the right with almost pinpoint pupils with full lights on.  Patient with relative afferent pupillary defect on the right V,VII: smile symmetric, facial light touch sensation normal bilaterally VIII: Hearing intact to voice IX,X: uvula rises symmetrically XI: bilateral shoulder shrug XII: midline tongue extension Motor: Right : Upper extremity   5/5    Left:     Upper  extremity   5/5  Lower extremity   5/5     Lower extremity   5/5 Tone and bulk:normal tone throughout; no atrophy noted Sensory: Sensation intact throughout, bilaterally Deep Tendon Reflexes: 2+ and symmetric throughout Plantars: Right: downgoing   Left: downgoing Cerebellar: normal finger-to-nose and normal heel-to-shin test Gait: Not assessed  Results for orders placed or performed during the hospital encounter of 07/02/17 (from the past 48 hour(s))  CBC with Differential     Status: None   Collection Time: 07/02/17  5:01 AM  Result Value Ref Range   WBC 6.9 4.0 - 10.5 K/uL   RBC 4.91 4.22 - 5.81 MIL/uL   Hemoglobin 14.6 13.0 - 17.0 g/dL   HCT 45.8 39.0 - 52.0 %   MCV 93.3 78.0 - 100.0 fL   MCH 29.7 26.0 - 34.0 pg   MCHC 31.9 30.0 - 36.0 g/dL   RDW 12.9 11.5 - 15.5 %   Platelets 247 150 - 400 K/uL   Neutrophils Relative % 62 %   Neutro Abs 4.3 1.7 - 7.7 K/uL   Lymphocytes Relative 24 %   Lymphs Abs 1.7 0.7 - 4.0  K/uL   Monocytes Relative 10 %   Monocytes Absolute 0.7 0.1 - 1.0 K/uL   Eosinophils Relative 3 %   Eosinophils Absolute 0.2 0.0 - 0.7 K/uL   Basophils Relative 0 %   Basophils Absolute 0.0 0.0 - 0.1 K/uL   Immature Granulocytes 1 %   Abs Immature Granulocytes 0.1 0.0 - 0.1 K/uL    Comment: Performed at [facility] [address] [place], [place] [address]  Sedimentation rate     Status: None   Collection Time: 07/02/17  5:01 AM  Result Value Ref Range   Sed Rate 1 0 - 16 mm/hr    Comment: Performed at [facility] [address], [place], [place] [address]  Comprehensive metabolic panel     Status: Abnormal   Collection Time: 07/02/17  5:01 AM  Result Value Ref Range   Sodium 141 135 - 145 mmol/L   Potassium 4.1 3.5 - 5.1 mmol/L   Chloride 106 101 - 111 mmol/L   CO2 27 22 - 32 mmol/L   Glucose, Bld 106 (H) 65 - 99 mg/dL   BUN 20 6 - 20 mg/dL   Creatinine, Ser 1.02 0.61 - 1.24 mg/dL   Calcium 9.4 8.9 - 10.3 mg/dL   Total Protein 7.0 6.5 -  8.1 g/dL   Albumin 3.6 3.5 - 5.0 g/dL   AST 15 15 - 41 U/L   ALT 16 (L) 17 - 63 U/L   Alkaline Phosphatase 46 38 - 126 U/L   Total Bilirubin 0.7 0.3 - 1.2 mg/dL   GFR calc non Af Amer >60 >60 mL/min   GFR calc Af Amer >60 >60 mL/min    Comment: (NOTE) The eGFR has been calculated using the CKD EPI equation. This calculation has not been validated in all clinical situations. eGFR's persistently <60 mL/min signify possible Chronic Kidney Disease.    Anion gap 8 5 - 15    Comment: Performed at [facility] [address] [place], [place] [address]  C-reactive protein     Status: None   Collection Time: 07/02/17  5:01 AM  Result Value Ref Range   CRP <0.8 <1.0 mg/dL    Comment: Performed at [facility], [place] [address] [place], [place] [address]   Mr Bertrum Sol And Wo Contrast 07/02/2017 IMPRESSION:  1. Asymmetric right perineural enhancement, suspect optic perineuritis in this setting.  2. 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   Assessment: 72 y.o. male with a past medical history of prostate cancer, tobacco abuse, arthritis, hypertension and dyslipidemia who presents to the ED today with complaints of right eye vision loss since Thursday, 06/29/2017. ESR and CRP are normal.  MRI of the brain and orbits with and without contrast reveals right apical perineuritis. GCA is less likely with a normal ESR CRP, and also does not show up on MRI. Perineuritis work-up is mandated at this time.  Can be associated with auto immune conditions. Optic neuritis/multiple sclerosis less likely to present with this age, and brain MRI findings are not typical- see attending discussion below  1.  Right perineural optic neuritis-with onset on Thursday 06/29/17.  Patient complains of complete right vision loss with ocular pain not associated with ocular movements.  On assessment patient has some reaction of the right pupil, and if this is true optic perineuritis, he  should have good response with steroids We will immediately start patient on high-dose steroids-Solu-Medrol 1000  mg IV x3 days and extend to 5 days total if it shows improvement.  Also evaluate for other underlying  autoimmune etiologies.   Discussed treatment extensively with patient and made him aware that he may not get spontaneously return of vision, but may have some improvement over the course of 6 months. 2. HTN 3. Dyslipidemia 4. Tobacco Abuse  Plan: -Immediately start Solu-Medrol 1000 mg IV x3 days and reassess and extend as needed -Pepcid or Protonix while patient is on Solu-Medrol. -Evaluate for autoimmune etiologies-ANA, rheumatoid factor, lupus panel, SSA, SSB, double-stranded DNA, ACE level.  Also check B12 level. -Fall precautions -Incidental subgaleal lipoma.  Can follow-up with primary care.  Neurology will continue to follow with you.  Jacob Moores DNP Neuro-hospitalist Team [phone] 07/02/2017, 11:28 AM   Attending Neurohospitalist Addendum Patient seen and examined with APP/Resident. Agree with the history and physical as documented above. Agree with the plan as documented, which I helped formulate. I have independently reviewed the chart, obtained history, review of systems and examined the patient.I have personally reviewed pertinent head/neck/spine imaging (CT/MRI).  MRI of the orbits shows right optic perineuritis.  MRI of the brain also shows mild chronic small vessel disease.  There are some periventricular lesions but they are atypical looking for multiple sclerosis, and that they are not round/oval and  not touching the ependymal surface and do not have a typical Dawson finger pattern.  Incidental subgaleal lipoma noted on MRI. Please feel free to call with any questions. --- Amie Portland, MD [facility] Pager: [phone]  If 7pm to 7am, please call on call as listed on AMION.

## 2017-07-02 NOTE — Discharge Summary (Signed)
Name: Brady Rodriguez MRN: [id number] DOB: 26-Mar-1945 72 y.o. PCP: Bartholome Bill, MD  Date of Admission: 07/02/2017  6:35 AM Date of Discharge: 07/06/2017 Attending Physician: Axel Filler, *  Discharge Diagnosis: 1. Idiopathic right optic perineuritis 2. Hx of HTN 3. Hx of prostate cancer  Active Problems:   Optic perineuritis   Discharge Medications: Allergies as of 07/06/2017   No Known Allergies     Medication List    TAKE these medications   amLODipine 10 MG tablet Commonly known as:  NORVASC Take 10 mg by mouth daily.   calcium carbonate 1250 (500 Ca) MG chewable tablet Commonly known as:  OS-CAL Chew 2 tablets (2,500 mg total) by mouth 2 (two) times daily.   dorzolamide-timolol 22.3-6.8 MG/ML ophthalmic solution Commonly known as:  COSOPT Place 1 drop into the right eye daily.   latanoprost 0.005 % ophthalmic solution Commonly known as:  XALATAN Place 1 drop into the right eye at bedtime.   omeprazole 20 MG capsule Commonly known as:  PRILOSEC Take 20 mg by mouth daily.   PARoxetine 40 MG tablet Commonly known as:  PAXIL Take 40 mg by mouth every morning.   prazosin 5 MG capsule Commonly known as:  MINIPRESS Take 5-10 mg by mouth See admin instructions. Take 5 mg by mouth in the morning and take 10 mg by mouth at bedtime   predniSONE 10 MG tablet Commonly known as:  DELTASONE Take 6 tablets for 3 days, then 5 for 3 days, then 4 for 3 days, then 3 for 3 days, then 2 for 3 days, then 1 for 3 days, then stop   traZODone 100 MG tablet Commonly known as:  DESYREL Take 100 mg by mouth as needed.       Disposition and follow-up:   Mr.Brady Rodriguez was discharged from [facility] in Good condition.  At the hospital follow up visit please address:  1.  Idiopathic right optic perineuritis - Assess visual symptoms. Patient had 20/20 vision on Snellen chart on discharge with some fuzziness in right eye. - Could  consider work-up for paraneoplastic syndrome with CT chest, although this is less likely and patient denied hemoptysis or unintentional weight loss - Ensure patient has been taking prednisone taper - Ensure follow up with Neurology and Ophthalmology  2.  Tobacco use - Patient endorsed smoking 3 cigarettes daily prior to admission and stated he wanted to quit on discharge. Continue counseling regarding cessation  3.  Labs / imaging needed at time of follow-up: None  4.  Pending labs/ test needing follow-up: None  Follow-up Appointments: Follow-up Information    [facility]. Schedule an appointment as soon as possible for a visit in 1 week(s).   Contact information: [place], [place]       Bartholome Bill, MD. Schedule an appointment as soon as possible for a visit in 1 week(s).   Specialty:  Family Medicine Contact information: [place] [address] [place], [place]. Schedule an appointment as soon as possible for a visit in 1 week(s).   Specialty:  Ophthalmology Contact information: [place] [address] [place] Hospital Course by problem list: Active Problems:   Optic perineuritis  1. Right optic perineuritis Patient admitted with 1 week of severe headache, right eye  pain, and right sided vision loss, found to have evidence of right optic perineuritis on MRI. Periventricular lesions noted on MRI felt to be atypical for multiple sclerosis, and ESR and CRP were negative, ruling out giant cell arteritis. He was evaluated by Neurology and received high-dose IV solumedrol for 5 days (5/19-5/23) with improvement in his vision. He was discharged with an 18 day steroid taper and plan to follow up with Neurology and Ophthalmology.  Most cases of optic perineuritis are idiopathic, however work-up for a possible  etiology was performed. Autoimmune labs were drawn which were all negative. Serum ACE level was also negative. Paraneoplastic process was considered as an etiology particularly with patient's hx of tobacco use, however this was not further evaluated due to the fact the optic perineuritis is only very rarely a paraneoplastic syndrome and patient denied hx of hemoptysis or unintentional weight loss.  2. Incidental subgaleal lipoma Outpatient follow-up with PCP.  3. Tobacco use Patient smoked 3 cigarettes daily prior to admission. He did state that he wanted to quit on discharge.  Discharge Vitals:   BP 137/86 (BP Location: Right Arm)   Pulse 76   Temp 98.2 F (36.8 C) (Oral)   Resp 18   Ht '5\' 11"'$  (1.803 m)   Wt 160 lb (72.6 kg)   SpO2 99%   BMI 22.32 kg/m   Pertinent Labs, Studies, and Procedures:  CBC Latest Ref Rng & Units 07/02/2017 12/23/2015 12/31/2013  WBC 4.0 - 10.5 K/uL 6.9 9.8 -  Hemoglobin 13.0 - 17.0 g/dL 14.6 14.1 14.6  Hematocrit 39.0 - 52.0 % 45.8 42.9 -  Platelets 150 - 400 K/uL 247 221 -   CMP Latest Ref Rng & Units 07/02/2017 12/29/2015 12/28/2015  Glucose 65 - 99 mg/dL 106(H) 128(H) 134(H)  BUN 6 - 20 mg/dL '20 18 14  '$ Creatinine 0.61 - 1.24 mg/dL 1.02 0.96 0.93  Sodium 135 - 145 mmol/L 141 138 137  Potassium 3.5 - 5.1 mmol/L 4.1 3.8 3.8  Chloride 101 - 111 mmol/L 106 109 104  CO2 22 - 32 mmol/L '27 26 25  '$ Calcium 8.9 - 10.3 mg/dL 9.4 9.9 11.0(H)  Total Protein 6.5 - 8.1 g/dL 7.0 - -  Total Bilirubin 0.3 - 1.2 mg/dL 0.7 - -  Alkaline Phos 38 - 126 U/L 46 - -  AST 15 - 41 U/L 15 - -  ALT 17 - 63 U/L 16(L) - -   UDS positive for THC CRP <0.8 ESR 1 RPR negative Vitamin B12 normal ACE 69 ANA negative RF negative Anti Jo-1 negative Ribonucleic protein, SS-A, SS-B, Scl-70, ENA Sm Ab, dsDNA negative  MRI brain/orbits 07/02/2017 1. Asymmetric right perineural enhancement, suspect optic perineuritis in this setting. 2. Mild chronic small vessel disease in the  cerebral white matter.  Discharge Instructions: Discharge Instructions    Diet - low sodium heart healthy   Complete by:  As directed    Increase activity slowly   Complete by:  As directed      Signed: Colbert Ewing, MD 07/06/2017, 12:53 PM   Pager: Mamie Nick [phone]

## 2017-07-02 NOTE — H&P (Addendum)
Date: 07/03/2017               Patient Name:  Brady Rodriguez MRN: [id number]  DOB: December 31, 1945 Age / Sex: 72 y.o., male   PCP: Bartholome Bill, MD         Medical Service: Internal Medicine Teaching Service         Attending Physician: Dr. Evette Doffing, Mallie Mussel, *    First Contact: Dr. Ronalee Red Pager: [phone]  Second Contact: Dr. Philipp Ovens Pager: [phone]       After Hours (After 5p/  First Contact Pager: [phone]  weekends / holidays): Second Contact Pager: [phone]   Chief Complaint: right eye pain and vision loss  History of Present Illness:  [name] is a 72yo male with PMH of HTN, current tobacco use, depression/anxiety, and Stage T1c prostate adenocarcinoma (Gleason 3+4) s/p radiation therapy who presents with right eye pain and vision loss for about 1 week.  He was in his usual state of health until 5/10 when he first noted sharp constant pain in his right eye not associated with ocular movement. He also had a severe right-sided headache, which alternates between constant nagging pain and sharp episodes. He has been taking some leftover oxycodone and ibuprofen '800mg'$  tablets for relief of the eye pain and headache. He went to the eye doctor on 5/15, where he had a dilated eye exam, which was reportedly normal. He was sent home and the following day, his right eye "felt funny". 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 He went back to the ophthalmologist the next day, who advised him to present to the ER. He started having some improvement in his eye, so he did not go to the ER on Friday, however he decided to come today due to persistence of his symptoms. Since then, he reports some improvement in the vision in his right eye, now able to see light and movement. He continues to endorse mild pain in his right eye and mild headache. He denies weakness or numbness. This has never occurred to him before.  Denies history of headaches, stroke,  seizures, or afib. No personal or family history of autoimmune disease, except for arthritis. He denies chest pain or palpitations. He does endorse intermittent dysuria and difficulty with urination - he recently completed 8 weeks of radiation therapy for prostate cancer in 01/2017. He reports missing about 2 doses of his medications weekly.  He smokes about 3 cigarettes daily and has for 35-40 years. Denies alcohol use or cocaine use. He is independent at baseline and lives at home alone. He did used to smoke marijuana, but stopped in December 2018 - also tried marijuana recently for his eye pain, which did not help.  ED Course: - BP 167/92, HR 77, temp 98.2, RR 18, O2 97% on RA - CBC, CMP wnl. ESR and CRP normal. - Evaluated by Neuro. MRI brain/orbits shows concern for optic perineuritis. EKG with NSR, small ST elevation in anterolateral leads comparable to previous, normal intervals, normal axis, no T wave inversions  Meds:  Current Meds  Medication Sig  . amLODipine (NORVASC) 10 MG tablet Take 10 mg by mouth daily.  . calcium carbonate (OS-CAL) 1250 (500 Ca) MG chewable tablet Chew 2 tablets (2,500 mg total) by mouth 2 (two) times daily.  . dorzolamide-timolol (COSOPT) 22.3-6.8 MG/ML ophthalmic solution Place 1 drop into the right eye daily.  Marland Kitchen latanoprost (XALATAN) 0.005 % ophthalmic  solution Place 1 drop into the right eye at bedtime.  Marland Kitchen omeprazole (PRILOSEC) 20 MG capsule Take 20 mg by mouth daily.  Marland Kitchen PARoxetine (PAXIL) 40 MG tablet Take 40 mg by mouth every morning.  . prazosin (MINIPRESS) 5 MG capsule Take 5-10 mg by mouth See admin instructions. Take 5 mg by mouth in the morning and take 10 mg by mouth at bedtime  . traZODone (DESYREL) 100 MG tablet Take 100 mg by mouth as needed.   Allergies: Allergies as of 07/02/2017  . (No Known Allergies)   Past Medical History:  Diagnosis Date  . Anxiety   . Arthritis   . Depression   . Dyspnea    with exertion  . GERD  (gastroesophageal reflux disease)   . Hypertension   . Personal history of colonic polyps 02/04/2002   hyperplastic  . Prostate cancer ([place])   . Tobacco use disorder   . Urinary frequency   . Wears glasses    Family History:  Family History  Problem Relation Age of Onset  . Colon cancer Neg Hx   . Esophageal cancer Neg Hx   . Stomach cancer Neg Hx   . Cancer Neg Hx    Social History:  - smokes about 3 cigarettes daily and has for 35-40 years - denies alcohol use or cocaine use - used to smoke marijuana, quit in 01/2017 - independent at baseline and lives at home alone  Review of Systems: Constitutional: Negative for diaphoresis and fever. HEENT: Positive for vision loss in right eye. Negative for congestion, sore throat or recent illness. Respiratory: Negative for cough, shortness of breath and wheezing.   Cardiovascular: Negative for chest pain, palpitations, and leg swelling. Gastrointestinal: Negative for abdominal pain, nausea and vomiting.  Genitourinary: Positive for chronic intermittent dysuria and difficulty urinating. Musculoskeletal: Negative for joint pain.  Neurological: Negative for dizziness, focal weakness. Positive for headaches. Skin: Negative for rashes Psych: Negative for depressed mood  Physical Exam: Blood pressure 124/85, pulse 86, temperature 98.5 F (36.9 C), temperature source Oral, resp. rate 18, height '5\' 11"'$  (1.803 m), weight 160 lb (72.6 kg), SpO2 97 %. GEN: Well-appearing, elderly male lying in bed in NAD. Alert and oriented x3 HENT: [place]/AT. Moist mucous membranes. No visible lesions. EYES: Right afferent pupillary defect. Sluggish reaction to light on R eye. Normal reaction to light on left eye. Vision intact to light and movement on right. Sclera non-icteric. Conjunctiva clear. EOMI intact. No ptosis. RESP: Clear to auscultation bilaterally. No wheezes, rales, or rhonchi. No increased work of breathing. CV: Normal rate and regular rhythm. No  murmurs, gallops, or rubs. No carotid bruits. No LE edema. ABD: Soft. Non-tender. Non-distended. Normoactive bowel sounds. EXT: No edema. Warm and well perfused. NEURO: R eye sluggish to light. Right afferent pupillary defect. Left eye reactive to light. Vision intact to light and movement on right. Other cranial nerves intact. 5/5 BUE and BLE strength. Normal sensation to light touch bilaterally. Normal finger-to-nose and heel-to-shin test. Speech fluent and appropriate. PSYCH: Patient is calm and pleasant. Appropriate affect. Well-groomed; speech is appropriate and on-subject.  EKG: personally reviewed my interpretation is NSR, small ST elevation in anterolateral leads comparable to previous, normal intervals, normal axis, no T wave inversions  MRI brain 1. Asymmetric right perineural enhancement, suspect optic perineuritis in this setting. 2. 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  Assessment & Plan by Problem: Active Problems:   Optic perineuritis  Mr. Granquist is a 72yo male  with PMH of HTN, current tobacco use, depression/anxiety, and Stage T1c prostate adenocarcinoma (Gleason 3+4) s/p radiation therapy who presents with right eye pain, vision loss, and headaches for about 1 week, found to have evidence of optic perineuritis on MRI. Patient has no history of these symptoms or other neurological deficits and MRI shows some periventricular lesions which are felt to be atypical for MS. ESR and CRP are negative, ruling out GCA. Neuro has evaluated and planning to further assess for autoimmune etiologies, as well as sarcoidosis.  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 Neuro has evaluated, planning for high dose steroids for at least 3 days and monitoring for clinical improvement. Will rule out underlying autoimmune etiologies. - Neurology consulted; appreciate their recommendations - Admit to med-surg -  IV solumedrol '1000mg'$  daily x3 days - F/u ANA, RF, anti-Jo1 Ab, anti-dsDNA - F/u ACE level, vitamin B12 level - PO pantoprazole '40mg'$  daily - Tylenol and toradol PRN for headache - CBG monitoring  Hx of HTN Home medications include amlodipine '10mg'$  daily - Continue home amlodipine '10mg'$  daily  Hx of Stage T1c prostate adenocarcinoma (Gleason 3+4) s/p radiation therapy 10-01/2017 Follows with Dr. Alyson Ingles in Urology - Continue home prazosin  Hx of depression/anxiety - Continue home paroxetine '20mg'$  daily  Diet: HH diet VTE PPx: Lovenox Code Status: Full code Dispo: Admit patient to Inpatient with expected length of stay greater than 2 midnights.  Signed: Colbert Ewing, MD 07/03/2017, 12:39 PM  Pager: Mamie Nick [phone]

## 2017-07-02 NOTE — ED Notes (Signed)
Neurologist at bedside. 

## 2017-07-02 NOTE — Progress Notes (Signed)
Arrived to 6n8 from [facility] at this time.

## 2017-07-02 NOTE — ED Notes (Signed)
Patient transported to MRI 

## 2017-07-02 NOTE — ED Provider Notes (Signed)
[facility] EMERGENCY DEPARTMENT Provider Note   CSN: [id number] Arrival date & time: 07/02/17  0426     History   Chief Complaint Chief Complaint  Patient presents with  . Eye Pain  . Loss of Vision    HPI Brady Rodriguez is a 72 y.o. male.  HPI Patient states he has been having right eye pain for close to 1 week.  States the pain is episodic.  Was evaluated on Wednesday at the [facility] clinic.  Thursday patient states he lost vision in his right eye completely.  Was evaluated back in clinic on Friday.  Referred to the emergency department by his ophthalmologist for MRI studies.  Patient states he still has some mild right eye pain.  Continues to have no vision in his right eye.  Denies headache, neck pain.  No focal weakness or numbness.  No slurred speech. Past Medical History:  Diagnosis Date  . Anxiety   . Arthritis   . Depression   . Dyspnea    with exertion  . GERD (gastroesophageal reflux disease)   . Hypertension   . Personal history of colonic polyps 02/04/2002   hyperplastic  . Prostate cancer ([place])   . Tobacco use disorder   . Urinary frequency   . Wears glasses     Patient Active Problem List   Diagnosis Date Noted  . Malignant neoplasm of prostate ([place]) 06/21/2016  . 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 Personal history of colonic polyps 01/18/2011  . Esophageal reflux 01/18/2011  . Epigastric pain 01/18/2011  . Depression 01/18/2011    Past Surgical History:  Procedure Laterality Date  . COLONOSCOPY    . HEMORRHOID SURGERY    . INGUINAL HERNIA REPAIR     left  . INGUINAL HERNIA REPAIR Right   . MASS EXCISION N/A 12/31/2013   Procedure: EXCISION OF SCALP MASS;  Surgeon: Coralie Keens, MD;  Location: [facility];  Service: General;  Laterality: N/A;  . PARATHYROIDECTOMY N/A 12/28/2015   Procedure: PARATHYROIDECTOMY;  Surgeon: Coralie Keens, MD;  Location: [facility];  Service: General;  Laterality: N/A;  .  POLYPECTOMY          Home Medications    Prior to Admission medications   Medication Sig Start Date End Date Taking? Authorizing Provider  amLODipine (NORVASC) 10 MG tablet Take 10 mg by mouth daily.   Yes [provider]  calcium carbonate (OS-CAL) 1250 (500 Ca) MG chewable tablet Chew 2 tablets (2,500 mg total) by mouth 2 (two) times daily. 12/29/15  Yes Coralie Keens, MD  dorzolamide-timolol (COSOPT) 22.3-6.8 MG/ML ophthalmic solution Place 1 drop into the right eye daily. 06/30/17  Yes [provider]  latanoprost (XALATAN) 0.005 % ophthalmic solution Place 1 drop into the right eye at bedtime. 06/30/17  Yes [provider]  omeprazole (PRILOSEC) 20 MG capsule Take 20 mg by mouth daily.   Yes [provider]  PARoxetine (PAXIL) 40 MG tablet Take 40 mg by mouth every morning.   Yes [provider]  prazosin (MINIPRESS) 5 MG capsule Take 5-10 mg by mouth See admin instructions. Take 5 mg by mouth in the morning and take 10 mg by mouth at bedtime   Yes [provider]  traZODone (DESYREL) 100 MG tablet Take 100 mg by mouth as needed.   Yes [provider]    Family History Family History  Problem Relation Age of Onset  . Colon cancer Neg Hx   .  Esophageal cancer Neg Hx   . Stomach cancer Neg Hx   . Cancer Neg Hx     Social History Social History   Tobacco Use  . Smoking status: Current Every Day Smoker    Packs/day: 0.25    Years: 50.00    Pack years: 12.50    Types: Cigarettes  . Smokeless tobacco: Never Used  . Tobacco comment: "3-4 cigarettes a day"  Substance Use Topics  . Alcohol use: Yes    Alcohol/week: 3.0 oz    Types: 6 Standard drinks or equivalent per week    Comment: socially  . Drug use: No     Allergies   Patient has no known allergies.   Review of Systems Review of Systems  Constitutional: Negative for chills and fever.  HENT: Negative for facial swelling and sinus pressure.     Eyes: Positive for pain and visual disturbance. Negative for photophobia and redness.  Respiratory: Negative for shortness of breath.   Cardiovascular: Negative for chest pain.  Gastrointestinal: Negative for abdominal pain, nausea and vomiting.  Musculoskeletal: Negative for back pain and neck pain.  Skin: Negative for rash and wound.  Neurological: Negative for dizziness, speech difficulty, weakness, light-headedness, numbness and headaches.  All other systems reviewed and are negative.    Physical Exam Updated Vital Signs BP (!) 147/97   Pulse (!) 57   Temp 98.2 F (36.8 C) (Oral)   Resp 13   Ht '5\' 11"'$  (1.803 m)   Wt 72.6 kg (160 lb)   SpO2 97%   BMI 22.32 kg/m   Physical Exam  Constitutional: He is oriented to person, place, and time. He appears well-developed and well-nourished.  HENT:  Head: Normocephalic and atraumatic.  Mouth/Throat: Oropharynx is clear and moist.  Eyes: Pupils are equal, round, and reactive to light. EOM are normal.  Pinpoint pupils bilaterally.  No photophobia.  No redness.   Neck: Normal range of motion. Neck supple.  Cardiovascular: Normal rate and regular rhythm. Exam reveals no gallop and no friction rub.  No murmur heard. Pulmonary/Chest: Effort normal and breath sounds normal.  Abdominal: Soft. Bowel sounds are normal. There is no tenderness. There is no rebound and no guarding.  Musculoskeletal: Normal range of motion. He exhibits no edema or tenderness.  Neurological: He is alert and oriented to person, place, and time.  Patient is alert and oriented x3 with clear, goal oriented speech. Patient has 5/5 motor in all extremities. Sensation is intact to light touch.   Skin: Skin is warm and dry. Capillary refill takes less than 2 seconds. No rash noted. No erythema.  Psychiatric: He has a normal mood and affect. His behavior is normal.  Nursing note and vitals reviewed.    ED Treatments / Results  Labs (all labs ordered are listed, but  only abnormal results are displayed) Labs Reviewed  COMPREHENSIVE METABOLIC PANEL - Abnormal; Notable for the following components:      Result Value   Glucose, Bld 106 (*)    ALT 16 (*)    All other components within normal limits  CBC WITH DIFFERENTIAL/PLATELET  SEDIMENTATION RATE  C-REACTIVE PROTEIN    EKG EKG Interpretation  Date/Time:  Sunday Jul 02 2017 09:06:49 EDT Ventricular Rate:  58 PR Interval:    QRS Duration: 89 QT Interval:  397 QTC Calculation: 390 R Axis:   32 Text Interpretation:  Sinus rhythm Abnormal R-wave progression, early transition Minimal ST elevation, anterior leads Confirmed by Julianne Rice [phone]) on 07/02/2017  11:15:18 AM   Radiology Mr Jeri Cos And Wo Contrast  Result Date: 07/02/2017 CLINICAL DATA:  Headache, acute, severe. Right eye pain with vision loss on Tuesday. EXAM: MRI HEAD AND ORBITS WITHOUT AND WITH CONTRAST TECHNIQUE: Multiplanar, multiecho pulse sequences of the brain and surrounding structures were obtained without and with intravenous contrast. Multiplanar, multiecho pulse sequences of the orbits and surrounding structures were obtained including fat saturation techniques, before and after intravenous contrast administration. CONTRAST:  66m MULTIHANCE GADOBENATE DIMEGLUMINE 529 MG/ML IV SOLN COMPARISON:  None. FINDINGS: MRI HEAD FINDINGS Brain: No acute infarction, hemorrhage, hydrocephalus, extra-axial collection or mass lesion. Mild FLAIR hyperintensity in the cerebral white matter, nonspecific but usually from chronic small vessel disease. Age normal brain volume Vascular: Major flow voids and vascular enhancements are preserved. Skull and upper cervical spine: No evidence of marrow lesion. 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 MRI ORBITS FINDINGS Orbits: Asymmetric enhancement around the right optic nerve sheath, see representative image 23001:23. Enhancement is wispy and more typical of inflammation than  meningioma. This entity also correlates with patient's eye pain. Symmetric normal appearance of the globes, extraocular muscles, lacrimal glands, and superior ophthalmic veins. Temporal arteritis usually has no MRI manifestations. Noted ESR and CRP in the chart. Visualized sinuses: Clear Soft tissues: Negative IMPRESSION: 1. Asymmetric right perineural enhancement, suspect optic perineuritis in this setting. 2. 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 Electronically Signed   By: JMonte FantasiaM.D.   On: 07/02/2017 09:29   Mr ORosealee AlbeeWZTContrast  Result Date: 07/02/2017 CLINICAL DATA:  Headache, acute, severe. Right eye pain with vision loss on Tuesday. EXAM: MRI HEAD AND ORBITS WITHOUT AND WITH CONTRAST TECHNIQUE: Multiplanar, multiecho pulse sequences of the brain and surrounding structures were obtained without and with intravenous contrast. Multiplanar, multiecho pulse sequences of the orbits and surrounding structures were obtained including fat saturation techniques, before and after intravenous contrast administration. CONTRAST:  160mMULTIHANCE GADOBENATE DIMEGLUMINE 529 MG/ML IV SOLN COMPARISON:  None. FINDINGS: MRI HEAD FINDINGS Brain: No acute infarction, hemorrhage, hydrocephalus, extra-axial collection or mass lesion. Mild FLAIR hyperintensity in the cerebral white matter, nonspecific but usually from chronic small vessel disease. Age normal brain volume Vascular: Major flow voids and vascular enhancements are preserved. Skull and upper cervical spine: No evidence of marrow lesion. 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 MRI ORBITS FINDINGS Orbits: Asymmetric enhancement around the right optic nerve sheath, see representative image 23001:23. Enhancement is wispy and more typical of inflammation than meningioma. This entity also correlates with patient's eye pain. Symmetric normal appearance of the globes, extraocular muscles, lacrimal glands, and  superior ophthalmic veins. Temporal arteritis usually has no MRI manifestations. Noted ESR and CRP in the chart. Visualized sinuses: Clear Soft tissues: Negative IMPRESSION: 1. Asymmetric right perineural enhancement, suspect optic perineuritis in this setting. 2. 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 Electronically Signed   By: JoMonte Fantasia.D.   On: 07/02/2017 09:29    Procedures Procedures (including critical care time)  Medications Ordered in ED Medications  gadobenate dimeglumine (MULTIHANCE) injection 15 mL (15 mLs Intravenous Contrast Given 07/02/17 0912)     Initial Impression / Assessment and Plan / ED Course  I have reviewed the triage vital signs and the nursing notes.  Pertinent labs & imaging results that were available during my care of the patient were reviewed by me and considered in my medical  decision making (see chart for details).     Visual loss in right eye.  Laboratory work-up including CRP and sed rate are normal.  Awaiting MRI. Discussed with neurology who will review patient's MRI and consult on the patient.  Internal medicine teaching service will admit. Final Clinical Impressions(s) / ED Diagnoses   Final diagnoses:  Optic perineuritis    ED Discharge Orders    None       Julianne Rice, MD 07/02/17 1115

## 2017-07-02 NOTE — ED Triage Notes (Signed)
Pt reports he began experiencing right eye pain and eventually total vision loss since Tuesday, pt was seen several times by [facility] and was sent to ED for testing.  Provider Pollina verbally ordered requested tests.

## 2017-07-02 NOTE — H&P (Signed)
Date: 07/02/2017               Patient Name:  Brady Rodriguez MRN: [id number]  DOB: 29-Aug-1945 Age / Sex: 72 y.o., male   PCP: Bartholome Bill, MD         Medical Service: Internal Medicine Teaching Service         Attending Physician: Dr. Evette Doffing, Mallie Mussel, *    First Contact: Dr. Ronalee Red Pager: [phone]  Second Contact: Dr. Philipp Ovens Pager: [phone]       After Hours (After 5p/  First Contact Pager: [phone]  weekends / holidays): Second Contact Pager: [phone]   Chief Complaint: Loss of vision  History of Present Illness: Brady Rodriguez is a 72 year old man with a history of hypertension, prostate cancer and tobacco use who presents to the ED with a four day history of right eye vision loss and nearly a week of eye pain. He began experiencing right eye pain early last week and visited [facility] for evaluation on Wednesday but did not have focal findings at that time. Thursday when he woke up he noted that he had completely lost vision in his right eye and could not see light or movement. He returned to the ophthalmology clinic and was referred to the ED but did not go at that time since he felt his vision was returning and he could see flashes of light. Today he presents to the ED due to continued pain and blindness. He describes his pain as sharp, constant and it has not been resolved by po pain medication. His pain is not worse with extra-ocular movement. He can see flashes of light and some movement.Vision in his left eye is intact. He endorses headaches prior to visual loss that were associated with his eye pain. He denies dizziness, recent loss of consciousness, recent illness or any weakness. He has no personal or family history of autoimmune disorders other than arthritis.   PMHx:  - Anxiety - Arthritis - Depression - GERD - HTN - Hyperplastic colonic polyps - Prostate cancer - Tobacco use disorder - Urinary frequency  Meds:  Current Meds    Medication Sig  . amLODipine (NORVASC) 10 MG tablet Take 10 mg by mouth daily.  . calcium carbonate (OS-CAL) 1250 (500 Ca) MG chewable tablet Chew 2 tablets (2,500 mg total) by mouth 2 (two) times daily.  . dorzolamide-timolol (COSOPT) 22.3-6.8 MG/ML ophthalmic solution Place 1 drop into the right eye daily.  Marland Kitchen latanoprost (XALATAN) 0.005 % ophthalmic solution Place 1 drop into the right eye at bedtime.  Marland Kitchen omeprazole (PRILOSEC) 20 MG capsule Take 20 mg by mouth daily.  Marland Kitchen PARoxetine (PAXIL) 40 MG tablet Take 40 mg by mouth every morning.  . prazosin (MINIPRESS) 5 MG capsule Take 5-10 mg by mouth See admin instructions. Take 5 mg by mouth in the morning and take 10 mg by mouth at bedtime  . traZODone (DESYREL) 100 MG tablet Take 100 mg by mouth as needed.     Allergies: Allergies as of 07/02/2017  . (No Known Allergies)   Past Medical History:  Diagnosis Date  . Anxiety   . Arthritis   . Depression   . Dyspnea    with exertion  . GERD (gastroesophageal reflux disease)   . Hypertension   . Personal history of colonic polyps 02/04/2002   hyperplastic  . Prostate cancer ([place])   . Tobacco use disorder   . Urinary frequency   .  Wears glasses     Family History: As per HPI.  Social History: Brady Rodriguez lives alone. He is a current smoker and smokes about 3 cigarettes per day. He does not drink alcohol and has no other drug use.   Review of Systems: A complete ROS was negative except as per HPI.   Physical Exam: Blood pressure (!) 157/98, pulse (!) 55, temperature 98.2 F (36.8 C), temperature source Oral, resp. rate 16, height '5\' 11"'$  (1.803 m), weight 72.6 kg (160 lb), SpO2 97 %. General: Laying in bed, appears tired but in no acute distress HEENT: MMM, no conjunctival injections or exudates. CV: RRR, no murmurs, rubs or gallops Resp: CTAB, normal work of breathing, no distress  Abd: Soft, +BS,  NTND Extr: no peripheral edema Neuro: Alert and oriented x3, Left pupil reactive  to light, right pupil sluggish, afferent pupillary defect. Extraocular movements intact, no loss of facial sensation or motor control, tongue midline, uvula rise symmetric, hearing intact. Strength 5/5, sensation intact throughout.  Skin: Warm, dry    EKG: personally reviewed my interpretation is sinus rhythm, bradycardic rate, mild ST elevations in V1-V4  MR Brain: 1. Asymmetric right perineural enhancement, suspect optic perineuritis in this setting. 2. 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  Assessment & Plan by Problem: Active Problems:   Optic perineuritis  Optic perineuritis: Brady Rodriguez is a 72 year old man with history of hypertension and prostate cancer presented with acute onset eye pain followed by monocular vision loss and imaging consistent with right optic nerve perineural inflammation. His presentation is consistent with optic neuritis. This condition is commonly associated with onset of multiple sclerosis and usually presents in much younger patients. Other possible etiologies of optic neuritis include paraneoplastic syndromes, autoimmune, or sarcoidosis. Most patients with optic neuritis recover vision within a year and treatment with IV steroids helps to prevent progression. We were also concerned about GCA but he had normal CRP and ESR. His prodrome of pain and imaging also make embolic occlusion of retinal artery unlikely cause of his vision loss.  - IV methylprednisolone 1000 mg q24h - Autoimmune antibody titers (ANA, Anti-Jo 1, extractrable nuclear antibody, Rh factor) - ACE level  HTN:  - amlodipine 10 mg qd  Urinary frequency: Patient has a history of urinary frequency secondary to prostate cancer treated with radiation treatment in December 2018.  - prazosin 5 mg  Dispo: Admit patient to Inpatient with expected length of stay greater than 2 midnights.  Signed: Buelah Manis, Medical Student 07/02/2017, 12:11 PM  Pager:  [phone]  This is a Medical Student Note.  The care of the patient was discussed with Dr. Ronalee Red and the assessment and plan was formulated with their assistance.  Please see their note for official documentation of the patient encounter.

## 2017-07-03 ENCOUNTER — Other Ambulatory Visit: Payer: Self-pay | Admitting: Ophthalmology

## 2017-07-03 ENCOUNTER — Encounter (HOSPITAL_COMMUNITY): Payer: Self-pay | Admitting: General Practice

## 2017-07-03 DIAGNOSIS — I10 Essential (primary) hypertension: Secondary | ICD-10-CM

## 2017-07-03 DIAGNOSIS — F339 Major depressive disorder, recurrent, unspecified: Secondary | ICD-10-CM

## 2017-07-03 DIAGNOSIS — Z923 Personal history of irradiation: Secondary | ICD-10-CM

## 2017-07-03 DIAGNOSIS — Z79899 Other long term (current) drug therapy: Secondary | ICD-10-CM

## 2017-07-03 DIAGNOSIS — Z720 Tobacco use: Secondary | ICD-10-CM

## 2017-07-03 DIAGNOSIS — C61 Malignant neoplasm of prostate: Secondary | ICD-10-CM

## 2017-07-03 DIAGNOSIS — F419 Anxiety disorder, unspecified: Secondary | ICD-10-CM

## 2017-07-03 DIAGNOSIS — H469 Unspecified optic neuritis: Secondary | ICD-10-CM

## 2017-07-03 LAB — GLUCOSE, CAPILLARY: Glucose-Capillary: 118 mg/dL — ABNORMAL HIGH (ref 65–99)

## 2017-07-03 LAB — RHEUMATOID FACTOR: Rhuematoid fact SerPl-aCnc: <10 IU/mL (ref 0.0–13.9)

## 2017-07-03 NOTE — Progress Notes (Signed)
   Subjective:  Brady Rodriguez was resting in bed this morning. He reports some improvement in the vision in his right eye. He can see some shapes. No recurrence of right eye pain. Discussed plan to continue steroids. Patient amenable to plan.  Records were obtained from ophthalmologist visit on 5/17 at [facility], reveal no findings on retinal exam. Ophthalmologist was concerned for possible ocular ischemic syndrome and recommended admission for labwork, MRI, and carotid studies.  Objective:  Vital signs in last 24 hours: Vitals:   07/02/17 1203 07/02/17 1339 07/02/17 2148 07/03/17 0443  BP: (!) 157/98 (!) 174/106 (!) 144/95 124/85  Pulse: (!) 55 (!) 52 64 86  Resp: 16  17 18   Temp:  97.7 F (36.5 C) 98.8 F (37.1 C) 98.5 F (36.9 C)  TempSrc:  Oral Oral Oral  SpO2: 97% 100% 98% 97%  Weight:      Height:       GEN: Lying in bed comfortably EYES: Right eye sluggish constriction to light, left eye sluggish constriction to shining light in right eye. Left eye reactive to light with normal right eye constriction CV: Bradycardic and RR, no m/r/g PULM: CTAB, no wheezes or rales  Assessment/Plan:  Active Problems:   Optic perineuritis  Brady Rodriguez is a 72yo male with PMH of HTN, current tobacco use, depression/anxiety, and Stage T1c prostate adenocarcinoma (Gleason 3+4) s/p radiation therapy who presents with painful monocular vision loss of right eye, which we are currently treating with high dose IV steroids for optic perineuritis. He has had some recovery of his vision in his right eye this morning after 1 day of steroids. Evaluation by ophthalmologist prior to admission revealed no findings on ocular exam - carotid studies were also recommended to evaluate for ocular ischemic syndrome.  Right optic perineuritis Improved vision and pain in right eye after 1 day of high dose IV steroids. Vit B12 normal. Autoimmune antibodies pending. Will order RPR for syphilis, as well as  carotid U/S to evaluate for carotid disease. - Neurology consulted; appreciate their recommendations - IV solumedrol 1000mg  daily x3-5 days, pending response (5/19 -> present) - F/u ANA, RF, anti-Jo1 Ab, anti-dsDNA - F/u ACE level, RPR - F/u vascular carotid U/S - PO pantoprazole 40mg  daily - Tylenol and toradol PRN for headache - CBG monitoring while on steroids (no hx of diabetes)  Hx of HTN - Continue home amlodipine 10mg  daily  Hx of Stage T1c prostate adenocarcinoma (Gleason 3+4) s/p radiation therapy 10-01/2017 - Continue home prazosin  Hx of depression/anxiety - Continue home paroxetine 20mg  daily  Dispo: Anticipated discharge in approximately 3-4 day(s).   Colbert Ewing, MD 07/03/2017, 6:31 AM Pager: Mamie Nick [phone]

## 2017-07-03 NOTE — Progress Notes (Addendum)
Subjective: This morning Brady Rodriguez reports that he is feeling well. He reports decreased pain in his eye and head and has improved vision. He can now make out some shapes, in addition to light and movement. We discussed the plan to continue steroids for several more days. All questions were answered at this time.  Records obtained from ophthalmologist at [facility] suggests work up for possible ocular ischemic syndrome. However, test results revealed no findings suggesting acute ischemia.    Objective: Vital signs in last 24 hours: Vitals:   07/02/17 1203 07/02/17 1339 07/02/17 2148 07/03/17 0443  BP: (!) 157/98 (!) 174/106 (!) 144/95 124/85  Pulse: (!) 55 (!) 52 64 86  Resp: _0 Temp:  97.7 F (36.5 C) 98.8 F (37.1 C) 98.5 F (36.9 C)  TempSrc:  Oral Oral Oral  SpO2: 97% 100% 98% 97%  Weight:      Height:       Weight change:   Intake/Output Summary (Last 24 hours) at 07/03/2017 1031 Last data filed at 07/03/2017 0500 Gross per 24 hour  Intake 240 ml  Output 275 ml  Net -35 ml   Physical Exam:  General: Laying in bed, appears tired but in no acute distress HEENT: MMM, no conjunctival injections or exudates. Cataracts noted bilaterally. CV: RRR, no murmurs, rubs or gallops Resp: CTAB, normal work of breathing, no distress  Abd: Soft, +BS,  NTND Extr: no peripheral edema Neuro: Alert and oriented x3, Left pupil reactive to light, right pupil sluggish, afferent pupillary defect to right eye. Extraocular movements intact, no loss of facial sensation or motor control, tongue midline, uvula rise symmetric, hearing intact. Strength 5/5, sensation intact throughout.   Lab Results: CRP: <0.8, ESR: 1, Glucose 106, CBC wnl   Studies/Results: MRI Brain: Asymmetric right optic nerve perineural enhancement consistent with perineuritis.  Medications:  Scheduled Meds: . amLODipine  10 mg Oral Daily  . enoxaparin (LOVENOX) injection  40 mg Subcutaneous Q24H  .  pantoprazole  40 mg Oral Daily  . PARoxetine  20 mg Oral BH-q7a  . prazosin  10 mg Oral QHS  . prazosin  5 mg Oral Daily   Continuous Infusions: . methylPREDNISolone (SOLU-MEDROL) injection Stopped (07/02/17 1505)   PRN Meds:.acetaminophen **OR** acetaminophen, ketorolac, senna-docusate, traZODone Assessment/Plan: Active Problems:   Optic perineuritis In summary Brady Rodriguez is a relatively healthy man who presented with over a week of pain in his right eye and four days of vision loss that has been improving with steroid treatment. MRI shows right optic nerve perineural inflammation, suggesting optic neuritis as the most likely diagnosis. This condition generally resolves and healing is improved with high dose steroids. Etiology could be autoimmune, paraneoplastic or related to sarcoidosis. 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 His presentation with a prodrome of pain and partial resolution with steroids makes this diagnosis less likely, but given his age and hypertension we still need to rule out ischemia with carotid doppler ultrasound.  - Methylprednisolone 1000 mg IV q24h  - Pending labs ACE, autoimmune panel - Carotid ultrasound with doppler  HTN: - amlodipine 10 mg qd  Urinary frequency: Patient has a history of urinary frequency secondary to prostate cancer treated with radiation treatment in December 2018.  - prazosin 5 mg  This is a Careers information officer Note.  The care of the patient was discussed with Dr. Ronalee Red and the assessment and plan formulated with their assistance.  Please see their  attached note for official documentation of the daily encounter.   LOS: 1 day   Brady Rodriguez, Medical Student 07/03/2017, 10:31 AM

## 2017-07-03 NOTE — Progress Notes (Addendum)
NEURO HOSPITALIST PROGRESS NOTE     Subjective: Patient awake, alert in chair, no acute distress.Patient admits to improvement in vision his right eye, he is able to see the nurse practitioners in the room as well as make out somecolors.  He denies associated headache or ocular pain with movement.  Exam: Vitals:   07/02/17 2148 07/03/17 0443  BP: (!) 144/95 124/85  Pulse: 64 86  Resp: 17 18  Temp: 98.8 F (37.1 C) 98.5 F (36.9 C)  SpO2: 98% 97%    Physical Exam   HEENT-  Normocephalic, no lesions, without obvious abnormality.  Normal external eye and conjunctiva.   Lungs-, no excessive working breathing.  Saturations within normal limits Extremities- Warm, dry and intact Musculoskeletal-no joint tenderness, deformity or swelling Skin-warm and dry,     Neuro:  Mental Status: Alert, oriented, thought content appropriate.  Speech fluent without evidence of aphasia.  Able to follow 3 step commands without difficulty. Cranial Nerves: II: visual fields intact bilaterally. Patient can tell me how many fingers in each field of view. Patient blinks to threat in right eye from lower and mid portions of his eye. Patient with right eye could correctly identify the colors white, and pink but continues ot have some color dissociation. III,IV, VI: ptosis not present, extra-ocular motions intact bilaterally pupils equal, round, reactive to light and accommodation. No nystagmus noted today. Mild right afferent pupillary response V,VII: smile symmetric, facial light touch sensation normal bilaterally VIII: hearing normal bilaterally IX,X: uvula rises symmetrically XI: bilateral shoulder shrug XII: midline tongue extension Motor: Right : Upper extremity   5/5    Left:     Upper extremity   5/5  Lower extremity   5/5     Lower extremity   5/5 Tone and bulk:normal tone throughout; no atrophy noted Sensory:  light touch intact throughout, bilaterally Plantars: Right:  downgoing   Left: downgoing Cerebellar: normal finger-to-nose, normal rapid alternating movements and normal heel-to-shin test Gait: not assessed    Medications:  Scheduled: . amLODipine  10 mg Oral Daily  . enoxaparin (LOVENOX) injection  40 mg Subcutaneous Q24H  . pantoprazole  40 mg Oral Daily  . PARoxetine  20 mg Oral BH-q7a  . prazosin  10 mg Oral QHS  . prazosin  5 mg Oral Daily   Continuous: . methylPREDNISolone (SOLU-MEDROL) injection Stopped (07/02/17 1505)   UVO:[id number] **OR** acetaminophen, ketorolac, senna-docusate, traZODone  Pertinent Labs/Diagnostics:   Mr Bertrum Sol And Wo Contrast  07/02/2017 IMPRESSION:  1. Asymmetric right perineural enhancement, suspect optic perineuritis in this setting. 2. 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    Impression: 72 y.o. male with a past medical history of prostate cancer, tobacco abuse, arthritis, hypertension and dyslipidemia who presents to the ED today with complaints of right eye vision loss since Thursday, 06/29/2017 preceded by 5 days of headache. Denies temporal artery tenderness, jaw claudication, myalgia. Has mild arthritis.  ESR and CRP are normal.  MRI of the brain and orbits with and without contrast reveals right apical perineuritis. GCA is less likely with a normal ESR CRP, and also does not show up on MRI. Perineuritis work-up is mandated at this time.  Can be associated with auto immune conditions. Optic neuritis/multiple sclerosis less likely to present with this age, and brain MRI findings are not typical.  Impression:   R eye  painful vision loss    D/D Optic Perineuritis vs Atypical giant cell arteritis    On assessment this morning patient has improvement in vision in the right eye with  with ability to make out shapes, face outlines, light and some colors, but continues to have some color dissociation in the right eye. Patient can count fingers in temporal visual fields.   Patient has received 1 dose of high-dose Solu-Medrol at this time with notable improvement in vision.      Recommendations: -Continue Solu-Medrol 1000 mg IV x 5days and reassess and extend as needed. First dose was 07/02/17 -Pepcid or Protonix while patient is on Solu-Medrol. -Evaluate for autoimmune etiologies-ANA, rheumatoid factor, lupus panel, SSA, SSB, double-stranded DNA, ACE level.  Vitamin B12 within normal limits -Fall precautions -Incidental subgaleal lipoma.  Can follow-up with primary care.   Laurey Morale, NP-C [facility] [phone]  07/03/2017, 10:23 AM   NEUROHOSPITALIST ADDENDUM Seen and examined the patient today. I have reviewed the contents of history and physical exam as documented by PA/ARNP/Resident and agree with above documentation.  I have discussed and formulated the above plan as documented. Edits to the note have been made as needed.    Karena Addison Aroor MD [facility] [phone]   If 7pm to 7am, please call on call as listed on AMION.

## 2017-07-04 ENCOUNTER — Encounter (HOSPITAL_COMMUNITY): Payer: Medicare Other

## 2017-07-04 LAB — RPR: RPR: NONREACTIVE

## 2017-07-04 LAB — EXTRACTABLE NUCLEAR ANTIGEN ANTIBODY
ENA SM Ab Ser-aCnc: <0.2 AI (ref 0.0–0.9)
Ribonucleic Protein: 0.2 AI (ref 0.0–0.9)
SSA (Ro) (ENA) Antibody, IgG: <0.2 AI (ref 0.0–0.9)
Scleroderma (Scl-70) (ENA) Antibody, IgG: <0.2 AI (ref 0.0–0.9)
ds DNA Ab: <1 IU/mL (ref 0–9)

## 2017-07-04 LAB — ANTI-JO 1 ANTIBODY, IGG

## 2017-07-04 LAB — ANA W/REFLEX IF POSITIVE: ANA: NEGATIVE

## 2017-07-04 LAB — GLUCOSE, CAPILLARY: GLUCOSE-CAPILLARY: 117 mg/dL — AB (ref 65–99)

## 2017-07-04 LAB — ANGIOTENSIN CONVERTING ENZYME: Angiotensin-Converting Enzyme: 69 U/L (ref 14–82)

## 2017-07-04 MED ORDER — POLYETHYLENE GLYCOL 3350 17 G PO PACK
17.0000 g | PACK | Freq: Every day | ORAL | Status: DC
  Administered 2017-07-04 – 2017-07-06 (×3): 17 g via ORAL
  Filled 2017-07-04 (×3): qty 1

## 2017-07-04 NOTE — Progress Notes (Signed)
NEURO HOSPITALIST PROGRESS NOTE     Subjective: Patient is awake, alert, in no acute distress.  Patient states that his vision has gotten better since yesterday and was excited to tell us that he can now see some more colors.  He denies any headaches or ocular pain with movement.  Exam: Vitals:   07/03/17 2134 07/04/17 0522  BP: (!) 152/86 (!) 144/96  Pulse: 68 84  Resp: 18 17  Temp: 98.1 F (36.7 C) 98.1 F (36.7 C)  SpO2: 94% 95%    Physical Exam   HEENT-  Normocephalic, no lesions, without obvious abnormality.  Normal external eye and conjunctiva.   Lungs-, no excessive working breathing.  Saturations within normal limits Extremities- Warm, dry and intact Musculoskeletal-no joint tenderness, deformity or swelling Skin-warm and dry,    Neuro: Mental Status: Alert, oriented, thought content appropriate.  Speech fluent without evidence of aphasia.  Able to follow 3 step commands without difficulty. Cranial Nerves: II: visual fields intact bilaterally. Patient can tell me how many fingers in each field of view. Patient blinks to threat. Patient with right eye could correctly identify the colors white, pink, red, green, and yellow but continues to have some color dissociation. III,IV, VI: ptosis not present, extra-ocular motions intact bilaterally pupils equal, round, reactive to light and accommodation. No nystagmus noted today. V,VII: smile symmetric, facial light touch sensation normal bilaterally VIII: hearing normal bilaterally IX,X: uvula rises symmetrically XI: bilateral shoulder shrug XII: midline tongue extension Motor: Right :  Upper extremity   5/5                                      Left:     Upper extremity   5/5             Lower extremity   5/5                                                  Lower extremity   5/5 Tone and bulk:normal tone throughout; no atrophy noted Sensory:  light touch intact throughout,  bilaterally Plantars: Right: downgoing                                Left: downgoing Cerebellar: normal finger-to-nose, normal rapid alternating movements and normal heel-to-shin test Gait: not assessed    Medications:  Scheduled: . amLODipine  10 mg Oral Daily  . enoxaparin (LOVENOX) injection  40 mg Subcutaneous Q24H  . pantoprazole  40 mg Oral Daily  . PARoxetine  20 mg Oral BH-q7a  . polyethylene glycol  17 g Oral Daily  . prazosin  10 mg Oral QHS  . prazosin  5 mg Oral Daily   Continuous: . methylPREDNISolone (SOLU-MEDROL) injection 1,000 mg (07/04/17 1146)   VQQ:[id number] **OR** acetaminophen, ketorolac, senna-docusate, traZODone  Pertinent Labs/Diagnostics:   No results found.   Laurey Morale, NP-C [facility] [phone]  Impression: 72 y.o.malewith a past medical history of prostate cancer, tobacco abuse, arthritis, hypertension and dyslipidemia who presents to the ED today with complaints of right eye vision loss since Thursday, 06/29/2017 preceded  by 5 days of headache. Denies temporal artery tenderness, jaw claudication, myalgia. Has mild arthritis.  ESR and CRP are normal. MRI of the brain and orbits with and without contrast reveals right apical perineuritis. GCA is less likely with a normal ESR CRP, and also does not show up on MRI. Perineuritis work-up is mandated at this time. Can be associated with auto immune conditions. Optic neuritis/multiple sclerosis less likely to present with this age,and brain MRI findings are not typical.  Impression:   R eye painful vision loss  with preceding headache  D/D Optic Perineuritis vs Atypical giant cell arteritis  Recommendations: Continue Solu-Medrol 1000 mg IV x 5days and reassess and extend as needed. First dose was 07/02/17. Continues to improve.  -Pepcid or Protonix while patient is on Solu-Medrol. -Autoimmune etiologies studies pending:-ANA, rheumatoid factor, lupus panel, SSA,  SSB, double-stranded DNA,  ACE level is wnl. Vitamin B12 within normal limits -Fall precautions -Incidental subgaleal lipoma. Can follow-up with primary care. - will consider temporal artery biopsy  Laurey Morale, NP-C [facility] [phone]  07/04/2017, 12:11 PM   NEUROHOSPITALIST ADDENDUM Seen and examined the patient today. I have reviewed the contents of history and physical exam as documented by PA/ARNP/Resident and agree with above documentation.  I have discussed and formulated the above plan as documented. Edits to the note have been made as needed.    Karena Addison Aroor MD [facility] [phone]   If 7pm to 7am, please call on call as listed on AMION.

## 2017-07-04 NOTE — Progress Notes (Signed)
Subjective: This morning Mr. Kissoon is feeling very well. He reports no eye pain or headaches. He reports improved vision and says he can identify all the numbers on the clock across the room. He says the bottom of his visual field is much improved from yesterday. He says his color vision is also returning although he incorrectly identified several colors around the room. We talked about the plan to continue IV steroids for a total of five days and he is amenable to this plan.  He also complained of constipation with no bowel movements since Sunday and says that he normally takes a stool softener at home.  Objective: Vital signs in last 24 hours: Vitals:   07/03/17 0443 07/03/17 1345 07/03/17 2134 07/04/17 0522  BP: 124/85 133/79 (!) 152/86 (!) 144/96  Pulse: 86 69 68 84  Resp: '18 18 18 17  '$ Temp: 98.5 F (36.9 C) (!) 97.5 F (36.4 C) 98.1 F (36.7 C) 98.1 F (36.7 C)  TempSrc: Oral Oral Oral Oral  SpO2: 97% 97% 94% 95%  Weight:      Height:       Weight change:   Intake/Output Summary (Last 24 hours) at 07/04/2017 1020 Last data filed at 07/03/2017 2131 Gross per 24 hour  Intake 340 ml  Output -  Net 340 ml   Physical Exam:  General:Laying in bed, appears tired but inno acute distress HEENT:MMM, no conjunctival injections or exudates. Cataracts noted bilaterally. CV:RRR, no murmurs, rubs or gallops Resp:CTAB, normal work of breathing, no distress  Abd: Soft, +BS, NTND Extr:no peripheral edema Neuro: Alert and oriented x3,Left pupil reactive to light, right pupil sluggish, afferent pupillary defect to right eye. Extraocular movements intact, no loss of facial sensation or motor control, tongue midline, uvula rise symmetric, hearing intact. Strength 5/5, sensation intact throughout.   Lab Results: CBG: 117 ACE level: 69  Medications: I have reviewed the patient's current medications. Scheduled Meds: . amLODipine  10 mg Oral Daily  . enoxaparin (LOVENOX) injection   40 mg Subcutaneous Q24H  . pantoprazole  40 mg Oral Daily  . PARoxetine  20 mg Oral BH-q7a  . prazosin  10 mg Oral QHS  . prazosin  5 mg Oral Daily   Continuous Infusions: . methylPREDNISolone (SOLU-MEDROL) injection Stopped (07/03/17 1347)   PRN Meds:.acetaminophen **OR** acetaminophen, ketorolac, senna-docusate, traZODone Assessment/Plan:  Optic perineuritis: Mr. Broady presented with pain and vision loss and MRI imaging consistent with right optic nerve inflammation, consistent with optic perineuritis. GCA was ruled out by low ESR, and retinal artery occlusion is unlikely because of his history of pain and his improvement with IV steroids. He has regained a significant amount of vision and continues to have deficits limited to afferent pupillary defect, color vision and visual acuity. He is amenable to completing five days of IV steroids.  - Continue Methylprednisolone '1000mg'$  IV  - Continue to monitor for visual changes or improvement  Constipation: Patient complains of constipation since Sunday and reports he usually takes a stool softener at home ever since his radiation therapy for prostate cancer.  - senna-docusate 1 tablet qhs  HTN:  - amlodipine 10 mg qd  Urinary frequency: Patient takes prazosin for urinary frequency since his radiation therapy for prostate cancer last year. Has no current dysuria or frequency. - Prazosin 5 mg qd  This is a Careers information officer Note.  The care of the patient was discussed with Dr. Ronalee Red and the assessment and plan formulated with their assistance.  Please see their  attached note for official documentation of the daily encounter.   LOS: 2 days   Buelah Manis, Medical Student 07/04/2017, 10:20 AM

## 2017-07-04 NOTE — Progress Notes (Signed)
   Subjective:  Mr. Simonich was resting in bed this morning with two friends at bedside. He reports continued improvement in his right eye, now able to see shapes a little more clearly and able to discern some colors. Denies right eye pain or headache. Discussed plan to continue IV steroids for 5 days total, per Neurology recs. Patient amenable to plan.  Objective:  Vital signs in last 24 hours: Vitals:   07/03/17 0443 07/03/17 1345 07/03/17 2134 07/04/17 0522  BP: 124/85 133/79 (!) 152/86 (!) 144/96  Pulse: 86 69 68 84  Resp: 18 18 18 17   Temp: 98.5 F (36.9 C) (!) 97.5 F (36.4 C) 98.1 F (36.7 C) 98.1 F (36.7 C)  TempSrc: Oral Oral Oral Oral  SpO2: 97% 97% 94% 95%  Weight:      Height:       GEN: Lying in bed comfortably EYES: Right eye sluggish constriction to light, left eye sluggish constriction to shining light in right eye. Left eye reactive to light with normal right eye constriction CV: NR and RR, no m/r/g PULM: CTAB, no wheezes or rales MSK: No LE edema  Assessment/Plan:  Active Problems:   Optic perineuritis  Mr. 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 Continues to have gradual recovery of vision in right eye on day 3 of high dose IV steroids. Continuing with another 2 days of IV steroids, per Neurology recommendations.  Right optic perineuritis Day 3 of high dose IV steroids with continued improvement in vision in right eye. Syphilis nonreactive, autoimmune antibodies pending.  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   Colbert Ewing, MD 07/04/2017, 7:17 AM Pager: Mamie Nick [phone]

## 2017-07-05 DIAGNOSIS — H468 Other optic neuritis: Principal | ICD-10-CM

## 2017-07-05 LAB — GLUCOSE, CAPILLARY: Glucose-Capillary: 133 mg/dL — ABNORMAL HIGH (ref 65–99)

## 2017-07-05 MED ORDER — SODIUM CHLORIDE 0.9 % IV SOLN
1000.0000 mg | INTRAVENOUS | Status: DC
  Administered 2017-07-05: 1000 mg via INTRAVENOUS
  Filled 2017-07-05 (×2): qty 8

## 2017-07-05 NOTE — Progress Notes (Signed)
   Subjective:  Brady Rodriguez was sitting up in bed this morning. The vision in his right eye continues to improve gradually with the IV steroids.  Compared to yesterday, he states his vision is about the same. He reports blurry vision and ability to discern some colors. He also reports clouding over his right eye. Endorsed 1 episode of mild right eye pain earlier today. No current pain. He denies jaw claudication or temporal tenderness.  Objective:  Vital signs in last 24 hours: Vitals:   07/04/17 0522 07/04/17 1452 07/04/17 2101 07/05/17 0512  BP: (!) 144/96 (!) 162/91 (!) 152/90 (!) 146/83  Pulse: 84 92 79 79  Resp: 17  17 17   Temp: 98.1 F (36.7 C) 98.1 F (36.7 C) 98 F (36.7 C) 98.6 F (37 C)  TempSrc: Oral Oral Oral Oral  SpO2: 95% 96% 94% 98%  Weight:      Height:       GEN: Sitting in bed comfortably HEAD: No temporal artery tenderness EYES: Right eye constriction more responsive to light today. Left eye reactive to light with normal right eye constriction CV: NR and RR, no m/r/g PULM: CTAB, no wheezes or rales MSK: No LE edema  Assessment/Plan:  Active Problems:   Optic perineuritis  Brady Rodriguez is a 72yo male with PMH of HTN, current tobacco use, depression/anxiety, and Stage T1c prostate adenocarcinoma (Gleason 3+4) s/p radiation therapy who presents with painful monocular vision loss of right eye, found to have evidence of optic perineuritis on MRI. Continues to have gradual recovery of vision in right eye on high dose IV steroids. First dose 07/02/2017, plan to continue at least 5 days per Neuro recs. Currently on day 4.  Right optic perineuritis Day 4 out of 5 of high dose IV steroids with continued improvement in vision in right eye. Autoimmune antibodies negative. - Neurology consulted; appreciate their recommendations - IV solumedrol 1000mg  daily x5 days total (5/19 -> through 5/23) - PO pantoprazole 40mg  daily - Tylenol and toradol PRN for headache - CBG  monitoring while on steroids (no hx of diabetes)  Hx of HTN - Continue home amlodipine 10mg  daily  Hx of Stage T1c prostate adenocarcinoma (Gleason 3+4) s/p radiation therapy 10-01/2017 - Continue home prazosin  Hx of depression/anxiety - Continue home paroxetine 20mg  daily  Dispo: Anticipated discharge in approximately 1-2 day(s).   Colbert Ewing, MD 07/05/2017, 6:45 AM Pager: Mamie Nick [phone]

## 2017-07-05 NOTE — Progress Notes (Addendum)
                      NEURO HOSPITALIST PROGRESS NOTE     Subjective: Patient is awake, alert,in bed in no acute distress.  Patient states that his vision is the same as yesterday, reporting some cloudy vision in right eye. Patient states he has cataracts.   He denies any headaches or ocular pain with movement.    Exam: Vitals:   07/04/17 2101 07/05/17 0512  BP: (!) 152/90 (!) 146/83  Pulse: 79 79  Resp: 17 17  Temp: 98 F (36.7 C) 98.6 F (37 C)  SpO2: 94% 98%    Physical Exam   HEENT-  Normocephalic, no lesions, without obvious abnormality.  Normal external eye and conjunctiva.   Lungs-, no excessive working breathing.  Saturations within normal limits on RA Extremities- Warm, dry and intact Musculoskeletal-no joint tenderness, deformity or swelling Skin-warm and dry    Neuro:  Mental Status: Alert, oriented, thought content appropriate.  Speech fluent without evidence of aphasia.  Able to follow 3 step commands without difficulty. Cranial Nerves: II: Visual fields grossly normal,  Could identify, green, blue, brown, pink, white. The color dissociation continues in dim light.      Medications:  Scheduled: . amLODipine  10 mg Oral Daily  . enoxaparin (LOVENOX) injection  40 mg Subcutaneous Q24H  . pantoprazole  40 mg Oral Daily  . PARoxetine  20 mg Oral BH-q7a  . polyethylene glycol  17 g Oral Daily  . prazosin  10 mg Oral QHS  . prazosin  5 mg Oral Daily   Continuous:  LGX:QJJHERDEYCXKG **OR** acetaminophen, ketorolac, senna-docusate, traZODone  Pertinent Labs/Diagnostics:   No results found.     Impression:  72 y.o.malewith a past medical history of prostate cancer, tobacco abuse, arthritis, hypertension and dyslipidemia who presents to the ED today with complaints of right eye vision loss since Thursday, 06/29/2017 preceded by 5 days of headache. Denies temporal artery tenderness, jaw claudication, myalgia. Has mild arthritis.  ESR and CRP are  normal. MRI of the brain and orbits with and without contrast reveals right apical perineuritis. GCA is less likely with a normal ESR CRP, and also does not show up on MRI. Perineuritis work-up is completed. Perineuritis can be associated with auto immune conditions. Optic neuritis/multiple sclerosis less likely to present with this age,and brain MRI findings are not typical.   R eye painful vision loss with preceding headache  D/D Optic Perineuritis vs Atypical giant cell arteritis   Recommendations: Continue Solu-Medrol 1000 mg IV x 5days and reassess and extend as needed. First dose was 07/02/17. Continues to improve.  -Pepcid or Protonix while patient is on Solu-Medrol. -Autoimmune etiologies studies all negative  double-stranded DNA,  ACE level is wnl.Vitamin B12 within normal limits -Fall precautions -Incidental subgaleal lipoma. Can follow-up with primary care. - will consider temporal artery biopsy   Laurey Morale, MSN,NP-C [facility] [phone]   07/05/2017, 8:30 AM   NEUROHOSPITALIST ADDENDUM Seen and examined the patient today. I have reviewed the contents of history and physical exam as documented by PA/ARNP/Resident and agree with above documentation.  I have discussed and formulated the above plan as documented. Edits to the note have been made as needed.    Karena Addison Aroor MD [facility] [phone]   If 7pm to 7am, please call on call as listed on AMION.

## 2017-07-05 NOTE — Care Management Important Message (Signed)
Important Message  Patient Details  Name: Brady Rodriguez MRN: [id number] Date of Birth: 01-13-1946   Medicare Important Message Given:  Yes    Orbie Pyo 07/05/2017, 2:00 PM

## 2017-07-05 NOTE — Progress Notes (Signed)
Internal Medicine Attending:   I saw and examined the patient. I reviewed the resident's note and I agree with the resident's findings and plan as documented in the resident's note. No complaints, feels vision is improving, no jaw caudication no HA.  Overall appreciative of his care.  We discussed will need to stay at least 1 more day to complete high dose IV steroids.  Autoimmune workup reviewed and unrevealing.  Sed rate of 1.  Defer decision about TA biopsy to Neuro.

## 2017-07-05 NOTE — Progress Notes (Signed)
  Subjective: This morning Mr. Dredge was feeling very well. He reports improved vision in his right eye. He did have one episode of mild pain in his right eye this morning. We discussed his improvement and reasons for completing all 5 days of IV steroids and he is amenable to completing the course in the hospital. He has no further complaints and expressed that he has been very pleased with all hospital staff.   Objective: Vital signs in last 24 hours: Vitals:   07/04/17 0522 07/04/17 1452 07/04/17 2101 07/05/17 0512  BP: (!) 144/96 (!) 162/91 (!) 152/90 (!) 146/83  Pulse: 84 92 79 79  Resp: _0 Temp: 98.1 F (36.7 C) 98.1 F (36.7 C) 98 F (36.7 C) 98.6 F (37 C)  TempSrc: Oral Oral Oral Oral  SpO2: 95% 96% 94% 98%  Weight:      Height:       Weight change:   Intake/Output Summary (Last 24 hours) at 07/05/2017 1131 Last data filed at 07/05/2017 0514 Gross per 24 hour  Intake 360 ml  Output -  Net 360 ml   Physical Exam:  General:Sitting up in bed, conversant,  appears comfortable HEENT:MMM, no conjunctival injections or exudates.  CV:RRR, no murmurs, rubs or gallops Resp:CTAB, normal work of breathing, no distress  Abd: Soft, +BS, NTND Extr:no peripheral edema Neuro: Alert and oriented x3. Pupils equal round and reactive to light, no afferent pupillary defect. Extraocular movements intact.  Lab Results: Glucose: 133  Medications: I have reviewed the patient's current medications. Scheduled Meds: . amLODipine  10 mg Oral Daily  . enoxaparin (LOVENOX) injection  40 mg Subcutaneous Q24H  . pantoprazole  40 mg Oral Daily  . PARoxetine  20 mg Oral BH-q7a  . polyethylene glycol  17 g Oral Daily  . prazosin  10 mg Oral QHS  . prazosin  5 mg Oral Daily   Continuous Infusions: PRN Meds:.acetaminophen **OR** acetaminophen, ketorolac, senna-docusate, traZODone Assessment/Plan: Active Problems:   Optic perineuritis  Optic perineuritis: Mr. Manninen optic  perineuritis has been responding well to rest and steroid infusion. Autoantibody panel was negative, ACE level was wnl and ESR was low at 1 mm. GCA is unlikely due to the very low ESR, but it is possible to have atypical GCA (low ESR in 5% of cases). However he reports no jaw claudication or temporal tenderness. There is currently no clear inciting cause for his perineuritis, however he has been responding well to treatment with steroids. He is amenable to completing the steroid course in the hospital. - Methylprednisolone 1000 mg IV (day 4/5)  Constipation: Patient complains of constipation since Sunday and reports he usually takes a stool softener at home ever since his radiation therapy for prostate cancer.  - senna-docusate 1 tablet qhs  HTN:  - amlodipine 10 mg qd  Urinary frequency: Patient has a history of urinary frequency secondary to prostate cancer treated with radiation treatment in December 2018.  - prazosin 5 mg    This is a Careers information officer Note.  The care of the patient was discussed with Dr. Ronalee Red and the assessment and plan formulated with their assistance.  Please see their attached note for official documentation of the daily encounter.   LOS: 3 days   Buelah Manis, Medical Student 07/05/2017, 11:31 AM

## 2017-07-06 DIAGNOSIS — F17210 Nicotine dependence, cigarettes, uncomplicated: Secondary | ICD-10-CM

## 2017-07-06 DIAGNOSIS — G588 Other specified mononeuropathies: Secondary | ICD-10-CM

## 2017-07-06 DIAGNOSIS — D170 Benign lipomatous neoplasm of skin and subcutaneous tissue of head, face and neck: Secondary | ICD-10-CM

## 2017-07-06 DIAGNOSIS — Z7952 Long term (current) use of systemic steroids: Secondary | ICD-10-CM

## 2017-07-06 LAB — GLUCOSE, CAPILLARY: GLUCOSE-CAPILLARY: 110 mg/dL — AB (ref 65–99)

## 2017-07-06 MED ORDER — PREDNISONE 10 MG PO TABS: ORAL_TABLET | ORAL | 0 refills | Status: DC

## 2017-07-06 MED ORDER — SODIUM CHLORIDE 0.9 % IV SOLN
1000.0000 mg | INTRAVENOUS | Status: AC
  Administered 2017-07-06: 1000 mg via INTRAVENOUS
  Filled 2017-07-06: qty 8

## 2017-07-06 NOTE — Progress Notes (Addendum)
  Subjective: Mr. Brady Rodriguez reports that his vision has almost fully returned this morning. He says that he can read easily but it is still a little fuzzy when he reads with just his right eye. He reports he has had no recent eye pain or headaches. He feels ready to return home. All questions were answered at this time. Objective: Vital signs in last 24 hours: Vitals:   07/05/17 0512 07/05/17 1358 07/05/17 2056 07/06/17 0423  BP: (!) 146/83 (!) 156/89 (!) 142/82 137/86  Pulse: 79 66 78 76  Resp: 17  18   Temp: 98.6 F (37 C) 97.8 F (36.6 C) 98.2 F (36.8 C) 98.2 F (36.8 C)  TempSrc: Oral Oral Oral Oral  SpO2: 98% 99% 99% 99%  Weight:      Height:       Weight change:   Intake/Output Summary (Last 24 hours) at 07/06/2017 0757 Last data filed at 07/06/2017 0423 Gross per 24 hour  Intake 240 ml  Output -  Net 240 ml    Physical Exam: General:Sitting up in bed, conversant,  appears comfortable HEENT:MMM, no conjunctival injections or exudates. CV:RRR, no murmurs, rubs or gallops Resp:CTAB, normal work of breathing, no distress  Abd: Soft, +BS, NTND Extr:no peripheral edema Neuro: Alert and oriented x3. Pupils equal round and reactive to light, no afferent pupillary defect. Extraocular movements intact.  Lab Results: Glucose: 110  Medications: I have reviewed the patient's current medications. Scheduled Meds: . amLODipine  10 mg Oral Daily  . enoxaparin (LOVENOX) injection  40 mg Subcutaneous Q24H  . pantoprazole  40 mg Oral Daily  . PARoxetine  20 mg Oral BH-q7a  . polyethylene glycol  17 g Oral Daily  . prazosin  10 mg Oral QHS  . prazosin  5 mg Oral Daily   Continuous Infusions: . methylPREDNISolone (SOLU-MEDROL) injection Stopped (07/05/17 1625)   PRN Meds:.acetaminophen **OR** acetaminophen, ketorolac, senna-docusate, traZODone Assessment/Plan:  Optic perineuritis: Mr. Brady Rodriguez presented with right eye pain and vision loss confirmed to by optic  perineuritis by MRI brain and orbit. He has responded well to high dose IV steroids and is almost back to his baseline vision and has had no pain for several days. Possible etiologies for his episode of perineuritis include autoimmune, sarcoidosis or paraneoplastic but most cases are isolated and idiopathic. Autoimmune panel was negative, ACE level was within normal limits. The only etiology that has not been explicitly ruled out is paraneoplastic. Although he has a history of prostate cancer, paraneoplastic syndromes are very rarely associated with prostate cancer and he does not currently have signs or symptoms of lung cancer, which is more likely associated with paraneoplastic syndromes. 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 He will complete his five day course of 1000mg  methylpredisolone today and be discharged with a taper course of oral prednisone.  - 1000 mg methylprednisolone IV - Prednisone taper pack 15 days (60 mg x 3, 50 mg x 3, 40 mg x 3, 30 mg x 3, 20 mg x 3, 10 mg x 3) - Follow up with neurology outpatient - Follow up with ophthalmologist outpatient  This is a Medical Student Note.  The care of the patient was discussed with Dr. Maricela Bo and the assessment and plan formulated with their assistance.  Please see their attached note for official documentation of the daily encounter.   LOS: 4 days   Buelah Manis, Medical Student 07/06/2017, 7:57 AM

## 2017-07-06 NOTE — Discharge Instructions (Addendum)
Brady Rodriguez, thank you for being a great patient here at [facility]. While you were here, we treated you for inflammation around the nerve of your right eye, which is called optic perineuritis. We are not sure exactly what caused the inflammation, but we hope you continue to get better at home. Now that you have completed the IV steroids, it will be important to continue taking the oral steroid taper as described: You will take the 60 mg dose once a day for 3 days, then 50 mg once a day for 3 days, then 40 mg for 3 days, then 30 mg for 3 days, then 20 mg for 3 days, then 10 mg for 3 days, then stop. This is 18 days of steroids. You may continue to have improved vision and hopefully lose all of the blurriness in that eye.  Please make a follow up appointment with the neurologists at [facility] at [place]. [address] [place], [place] Their phone number is [phone].  Please also make a follow up appointment with your ophthalmologist at [facility] to have another eye exam to assess for any continued visual problems.   Please also call your primary doctor to schedule a hospital follow up appointment.  It is possible for optic perineuritis to happen again, so it is very important that you monitor your vision and eye pain closely. If you have return of pain in either eye or lose vision again, you should go to the emergency department.

## 2017-07-06 NOTE — Progress Notes (Signed)
Patient  Discharged home today. Prescription called in to pharmacy of choice, [facility] at [facility]. and advised to pick it up.Discharged instructions,personal belongings given to patient. Verbalized understanding of instructions, no further questions asked

## 2017-07-06 NOTE — Progress Notes (Signed)
   Subjective:  Brady Rodriguez was sitting up in bed this morning. He reports feeling well. States that he is able to read with his right eye now, still having some "fuzziness", but much improved. Denies eye pain or headaches yesterday. He denies a history of hemoptysis or unintentional weight loss. Has had episodes of night sweats 2x/month since starting radiation therapy for prostate cancer. Discussed plan for one more dose of IV steroids today, then discharge home with plan for steroid taper. Patient amenable to plan.  Objective:  Vital signs in last 24 hours: Vitals:   07/05/17 0512 07/05/17 1358 07/05/17 2056 07/06/17 0423  BP: (!) 146/83 (!) 156/89 (!) 142/82 137/86  Pulse: 79 66 78 76  Resp: 17  18   Temp: 98.6 F (37 C) 97.8 F (36.6 C) 98.2 F (36.8 C) 98.2 F (36.8 C)  TempSrc: Oral Oral Oral Oral  SpO2: 98% 99% 99% 99%  Weight:      Height:       GEN: Sitting in bed comfortably LYMPH: No cervical or axillary lymphadenopathy EYES: PERRL CV: NR and RR, no m/r/g PULM: CTAB, no wheezes or rales EXT: Mild clubbing  Assessment/Plan:  Active Problems:   Optic perineuritis  Brady Rodriguez is a 72yo male with PMH of HTN, current tobacco use, depression/anxiety, and 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  Idiopathic right optic perineuritis Day 5 out of 5 of high dose IV steroids with continued improvement in vision in right eye. Stable for discharge today with plan for steroid taper. Most cases of optic perineuritis are idiopathic, it very seldomly presents as a paraneoplastic syndrome. Patient does have a hx of tobacco use, however patient denies hemoptysis or unintentional weight loss. - Last dose of IV solumedrol 1000mg  daily today (5/19 -> through 5/23) - Start prednisone taper tomorrow, 60mg  x3d -> 50mg  x3d -> 40mg  x3d -> 30mg  x3d -> 20mg   x3d -> 10mg  x3d, then stop - F/u with Neurology and Ophthalmology  Tobacco use Smoked 3 cigarettes daily prior to admission. He is motivated to quit smoking and states he is going to do this when he leaves the hospital. Encouraged patient to abstain. - Counseled - F/u with PCP  Hx of HTN - Continue home amlodipine 10mg  daily  Hx of Stage T1c prostate adenocarcinoma (Gleason 3+4) s/p radiation therapy 10-01/2017 - Continue home prazosin  Hx of depression/anxiety - Continue home paroxetine 20mg  daily  Dispo: Anticipated discharge today.  Colbert Ewing, MD 07/06/2017, 7:08 AM Pager: Mamie Nick [phone]

## 2017-07-06 NOTE — Discharge Summary (Signed)
Medical Student Discharge Summary  Patient ID: SHAWNTEZ DICKISON MRN: [id number] DOB/AGE: 04/13/1945 72 y.o.  Admit date: 07/02/2017 Discharge date: 07/06/2017  Admission Diagnoses: Optic Perineuritis   Discharge Diagnoses: Optic Perineuritis  Hospital Course: Mr. Prout presented to the Emergency department after about one week of eye pain and four days of vision loss in his right eye. He reported headaches associated with his eye pain but no other symptoms. He was able to see some flashes of light and movement which was an improvement from the initial onset of vision loss. The first day that he lost his vision, he visited his ophthalmologist at [facility] and had a dilated eye exam which revealed no abnormalities. His ophthalmologist was concerned for ocular ischemic syndrome at that time and suggested he go to the ED for work up. Mr. Genther delayed going to the hospital for several days and came to the ED when his symptoms failed to improve. On presentation to the ED, Mr. Pinder had a sluggish right pupil and afferent pupillary defect and he could not see out of his right eye but had no further neurologic deficits. He did not have pain with eye movement.  An MRI of his brain and orbits was obtained which showed asymmetric right optic perineural enhancement suggesting optic perineuritis. The imaging was negative for acute ischemia or hemorrhage. At this time, Mr. Milliron was started on high dose methylprednisolone IV 1000 mg per day for a five day course. Mr. Duval showed steady improvement over the next five days. He was able to see general shapes on day two, could read the clock across the room and see colors on day three and could read with no difficulty on day five. His afferent pupillary defect was no longer present on exam on day four of IV steroids.  Work up for the cause of this perineuritis excluded autoimmune diseases, sarcoidosis and giant cell arteritis. 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  Mr. Gamero will receive a steroid taper pack for 15 days on discharge.   Discharge Exam: Blood pressure 137/86, pulse 76, temperature 98.2 F (36.8 C), temperature source Oral, resp. rate 18, height 5\' 11"  (1.803 m), weight 72.6 kg (160 lb), SpO2 99 %. General:Sitting up in bed, conversant, appears comfortable HEENT:MMM, no conjunctival injections or exudates. CV:RRR, no murmurs, rubs or gallops Resp:CTAB, normal work of breathing, no distress  Abd: Soft, +BS, NTND Extr:no peripheral edema Neuro: Alert and oriented x3.Pupils equal round and reactive to light, no afferent pupillary defect.Extraocular movements intact  Disposition: Discharge disposition: 01-Home or Self Care     Ready for discharge  Discharge Instructions    Diet - low sodium heart healthy   Complete by:  As directed    Increase activity slowly   Complete by:  As directed      Allergies as of 07/06/2017   No Known Allergies     Medication List    TAKE these medications   amLODipine 10 MG tablet Commonly known as:  NORVASC Take 10 mg by mouth daily.   calcium carbonate 1250 (500 Ca) MG chewable tablet Commonly known as:  OS-CAL Chew 2 tablets (2,500 mg total) by mouth 2 (two) times daily.   dorzolamide-timolol 22.3-6.8 MG/ML ophthalmic solution Commonly known as:  COSOPT Place 1 drop into the right eye daily.   latanoprost 0.005 % ophthalmic solution Commonly known as:  XALATAN Place 1 drop into the right eye at bedtime.  omeprazole 20 MG capsule Commonly known as:  PRILOSEC Take 20 mg by mouth daily.   PARoxetine 40 MG tablet Commonly known as:  PAXIL Take 40 mg by mouth every morning.   prazosin 5 MG capsule Commonly known as:  MINIPRESS Take 5-10 mg by mouth See admin instructions. Take 5 mg by mouth in the morning and take 10 mg by mouth at bedtime   predniSONE 10 MG tablet Commonly known as:  DELTASONE Take 6  tablets for 3 days, then 5 for 3 days, then 4 for 3 days, then 3 for 3 days, then 2 for 3 days, then 1 for 3 days, then stop   traZODone 100 MG tablet Commonly known as:  DESYREL Take 100 mg by mouth as needed.      Follow-up Information    [facility]. Schedule an appointment as soon as possible for a visit in 1 week(s).   Contact information: [place], [place]       Bartholome Bill, MD. Schedule an appointment as soon as possible for a visit in 1 week(s).   Specialty:  Family Medicine Contact information: [place] [address] [place], [place]. Schedule an appointment as soon as possible for a visit in 1 week(s).   Specialty:  Ophthalmology Contact information: [place] [address] [phone]           Signed: Buelah Manis 07/06/2017, 1:07 PM

## 2017-07-06 NOTE — Progress Notes (Addendum)
NEURO HOSPITALIST SIGN-OFF NOTE       Subjective: Patient is awake alert in bed no acute distress on telephone upon entering the room.  Patient states that his vision has gotten better, and the right eye cloudiness is clearing up.  Today patient could identify colors even in dim light.  Patient with right eye  20/20 on Snellen chart.  Patient denies any headaches or ocular pain with movement.  Exam: Vitals:   07/05/17 2056 07/06/17 0423  BP: (!) 142/82 137/86  Pulse: 78 76  Resp: 18   Temp: 98.2 F (36.8 C) 98.2 F (36.8 C)  SpO2: 99% 99%    Physical Exam   HEENT-  Normocephalic, no lesions, without obvious abnormality.  Normal external eye and conjunctiva.   Cardiovascular- S1-S2 audible, pulses palpable throughout   Lungs-no rhonchi or wheezing noted, no excessive working breathing.  Saturations within normal limits Abdomen- All 4 quadrants palpated and nontender Extremities- Warm, dry and intact Musculoskeletal-no joint tenderness, deformity or swelling Skin-warm and dry, no hyperpigmentation, vitiligo, or suspicious lesions    Physical Exam   HEENT-  Normocephalic, no lesions, without obvious abnormality.  Normal external eye and conjunctiva.   Lungs-, no excessive working breathing.  Saturations within normal limits on RA Extremities- Warm, dry and intact Musculoskeletal-no joint tenderness, deformity or swelling Skin-warm and dry    Neuro: Mental Status: Alert, oriented, thought content appropriate.  Speech fluent without evidence of aphasia.  Able to follow 3 step commands without difficulty. Cranial Nerves:  Visual fields grossly normal,  Could identify, green, tan,  black, pink, white. The color dissociation  in dim light appears to have resolved.     Medications:  Scheduled: . amLODipine  10 mg Oral Daily  . enoxaparin (LOVENOX) injection  40 mg Subcutaneous Q24H  . pantoprazole  40 mg Oral Daily  . PARoxetine  20 mg Oral  [id number]  . polyethylene glycol  17 g Oral Daily  . prazosin  10 mg Oral QHS  . prazosin  5 mg Oral Daily   Continuous: . methylPREDNISolone (SOLU-MEDROL) injection Stopped (07/05/17 1625)   TGG:YIRSWNIOEVOJJ **OR** acetaminophen, ketorolac, senna-docusate, traZODone  Pertinent Labs/Diagnostics:   No results found.   Impression: 72 y.o.malewith a past medical history of prostate cancer, tobacco abuse, arthritis, hypertension and dyslipidemia who presents to the ED  On 07/02/2017 with complaints of right eye vision loss since Thursday, 06/29/2017 preceded by 5 days of headache. Denies temporal artery tenderness, jaw claudication, myalgia. Has mild arthritis.  ESR and CRP are normal. MRI of the brain and orbits with and without contrast reveals right apical perineuritis. GCA is less likely with a normal ESR CRP, and also does not show up on MRI. Perineuritis work-up is completed. Perineuritis can be associated with auto immune conditions, however so far workup is negative. Marland Kitchen Optic neuritis/multiple sclerosis less likely to present with this age,and brain MRI findings are not typical.   R eye painful vision losswith preceding headache  D/D Optic Perineuritis vs Atypical giant cell arteritis   Recommendations: --Complete Solu-Medrol 1000 mg IV x5days. First dose was 07/02/17. Continues to improve.Today should be last dose of IV solu-medrol. --Start  prednisone taper at 60 mg per day x 3 days, then 50 mg x 3 days, then 40 mg x 3 days, then 30 mg x 3 days, then 20 mg x 3 days,  then10 mg  x 3 then stop. - However, give prescription for '10mg'$  tab  in case vision loss returns -Pepcid or Protonix while patient is on Solu-Medrol. -Autoimmune etiologiesstudies all negative -Fall precautions -Incidental subgaleal lipoma. Can follow-up with primary care. - f/u with outpatient Neurology     Neurology to sign off at this time. Please call with any further questions or  concerns.    Laurey Morale, MSN,NP-C [facility] [phone]    07/06/2017, 9:47 AM    NEUROHOSPITALIST ADDENDUM Seen and examined the patient today. I have reviewed the contents of history and physical exam as documented by PA/ARNP/Resident and agree with above documentation.  I have discussed and formulated the above plan as documented. Edits to the note have been made as needed.    Karena Addison Aroor MD [facility] [phone]   If 7pm to 7am, please call on call as listed on AMION.

## 2017-08-30 ENCOUNTER — Telehealth: Payer: Self-pay | Admitting: Diagnostic Neuroimaging

## 2017-08-30 ENCOUNTER — Ambulatory Visit (INDEPENDENT_AMBULATORY_CARE_PROVIDER_SITE_OTHER): Payer: Medicare Other | Admitting: Diagnostic Neuroimaging

## 2017-08-30 ENCOUNTER — Encounter: Payer: Self-pay | Admitting: Diagnostic Neuroimaging

## 2017-08-30 VITALS — BP 132/80 | HR 88 | Ht 71.0 in | Wt 160.8 lb

## 2017-08-30 DIAGNOSIS — H469 Unspecified optic neuritis: Secondary | ICD-10-CM | POA: Diagnosis not present

## 2017-08-30 DIAGNOSIS — H5461 Unqualified visual loss, right eye, normal vision left eye: Secondary | ICD-10-CM

## 2017-08-30 MED ORDER — PREDNISONE 10 MG PO TABS: ORAL_TABLET | ORAL | 6 refills | Status: DC

## 2017-08-30 NOTE — Progress Notes (Signed)
[facility]  PATIENT: Brady Rodriguez DOB: [birth year]  REFERRING CLINICIAN: Antony Blackbird HISTORY FROM: patient  REASON FOR VISIT: new consult    HISTORICAL  CHIEF COMPLAINT:  Chief Complaint  Patient presents with  . NP  Dr. Precious Haws, MD    [facility] x 2.  . Optic Perineuritis    R eye loss of vision, Last of may (memorial day),  Went to [place] IV steroid '1000mg'$  x 5 days. (legs got sore).      HISTORY OF PRESENT ILLNESS:   72 year old male here for evaluation of right optic perineuritis.  May 2019 patient had severe right eye pain, right-sided vision loss, headache, and was evaluated by ophthalmology.  Patient had MRI of the brain and orbits which showed possible right optic perineuritis and was admitted to the hospital.  Patient was treated with IV steroids for 5 days and discharged with prednisone taper.  On the last day of hospitalization patient's right eye vision symptoms had fully resolved.    Since that time patient is done well until 1 week ago when his right eye symptoms have returned.  He does not have significant pain compared to last time but he does have some slight discomfort and has had significant vision loss.  He is barely able to detect light and motion in the right eye.  Patient denies any problems with his left eye.  No numbness or tingling.  No weakness.  No headaches.  No slurred speech or trouble talking.  No gait or balance difficulty.  No vertigo.   REVIEW OF SYSTEMS: Full 14 system review of systems performed and negative with exception of: Fatigue ringing in ears blurred vision loss of vision eye pain headache depression anxiety cramps constipation impotence decreased energy allergies.   ALLERGIES: No Known Allergies  HOME MEDICATIONS: Outpatient Medications Prior to Visit  Medication Sig Dispense Refill  . amLODipine (NORVASC) 10 MG tablet Take 10 mg by mouth daily.    . calcium carbonate (OS-CAL) 1250 (500 Ca) MG  chewable tablet Chew 2 tablets (2,500 mg total) by mouth 2 (two) times daily. 60 tablet 0  . omeprazole (PRILOSEC) 20 MG capsule Take 20 mg by mouth daily.    Marland Kitchen PARoxetine (PAXIL) 40 MG tablet Take 40 mg by mouth every morning.    . prazosin (MINIPRESS) 5 MG capsule Take 5-10 mg by mouth See admin instructions. Take 5 mg by mouth in the morning and take 10 mg by mouth at bedtime    . senna-docusate (COLACE 2-IN-1) 8.6-50 MG tablet Take 1 tablet by mouth daily.    . traZODone (DESYREL) 100 MG tablet Take 100 mg by mouth as needed.    . dorzolamide-timolol (COSOPT) 22.3-6.8 MG/ML ophthalmic solution Place 1 drop into the right eye daily.  0  . latanoprost (XALATAN) 0.005 % ophthalmic solution Place 1 drop into the right eye at bedtime.  0  . predniSONE (DELTASONE) 10 MG tablet Take 6 tablets for 3 days, then 5 for 3 days, then 4 for 3 days, then 3 for 3 days, then 2 for 3 days, then 1 for 3 days, then stop 63 tablet 0   No facility-administered medications prior to visit.     PAST MEDICAL HISTORY: Past Medical History:  Diagnosis Date  . Anxiety   . Arthritis    "knees, back, shoulders" (07/03/2017)  . Depression   . Dyspnea    with exertion  . GERD (gastroesophageal reflux disease)   .  Hypertension   . Personal history of colonic polyps 02/04/2002   hyperplastic  . Prostate cancer ([place])    S/P "8 weeks radiation end of last year" (07/03/2017)  . Tobacco use disorder   . Urinary frequency   . Wears glasses     PAST SURGICAL HISTORY: Past Surgical History:  Procedure Laterality Date  . COLONOSCOPY W/ BIOPSIES AND POLYPECTOMY  2013  . EXCISIONAL HEMORRHOIDECTOMY    . INGUINAL HERNIA REPAIR Bilateral    x 2 2000, 2005  . MASS EXCISION N/A 12/31/2013   Procedure: EXCISION OF SCALP MASS;  Surgeon: Coralie Keens, MD;  Location: [facility];  Service: General;  Laterality: N/A;  . PARATHYROIDECTOMY N/A 12/28/2015   Procedure: PARATHYROIDECTOMY;  Surgeon: Coralie Keens, MD;  Location: [facility];  Service: General;  Laterality: N/A;  . TONSILLECTOMY      FAMILY HISTORY: Family History  Problem Relation Age of Onset  . Colon cancer Neg Hx   . Esophageal cancer Neg Hx   . Stomach cancer Neg Hx   . Cancer Neg Hx     SOCIAL HISTORY:  Social History   Socioeconomic History  . Marital status: Divorced    Spouse name: Not on file  . Number of children: 2  . Years of education: Not on file  . Highest education level: Not on file  Occupational History  . Occupation: retired    Fish farm manager: [employer]  Social Needs  . Financial resource strain: Not on file  . Food insecurity:    Worry: Not on file    Inability: Not on file  . Transportation needs:    Medical: Not on file    Non-medical: Not on file  Tobacco Use  . Smoking status: Current Every Day Smoker    Packs/day: 0.12    Years: 50.00    Pack years: 6.00    Types: Cigarettes  . Smokeless tobacco: Never Used  . Tobacco comment: "3-4 cigarettes a day"  Substance and Sexual Activity  . Alcohol use: Not Currently    Alcohol/week: 0.6 oz    Types: 1 Cans of beer per week  . Drug use: No  . Sexual activity: Not Currently  Lifestyle  . Physical activity:    Days per week: Not on file    Minutes per session: Not on file  . Stress: Not on file  Relationships  . Social connections:    Talks on phone: Not on file    Gets together: Not on file    Attends religious service: Not on file    Active member of club or organization: Not on file    Attends meetings of clubs or organizations: Not on file    Relationship status: Not on file  . Intimate partner violence:    Fear of current or ex partner: Not on file    Emotionally abused: Not on file    Physically abused: Not on file    Forced sexual activity: Not on file  Other Topics Concern  . Not on file  Social History Narrative   Lives home alone.  Retired.  Is divorced.  Education : BS.  Children 2.  Andre-son on [employer].      PHYSICAL EXAM  GENERAL EXAM/CONSTITUTIONAL: Vitals:  Vitals:   08/30/17 1236  BP: 132/80  Pulse: 88  Weight: 160 lb 12.8 oz (72.9 kg)  Height: '5\' 11"'$  (1.803 m)     Body mass index is 22.43 kg/m.  Visual Acuity Screening  Right eye Left eye Both eyes  Without correction:     With correction: 0 20/100      Patient is in no distress; well developed, nourished and groomed; neck is supple  CARDIOVASCULAR:  Examination of carotid arteries is normal; no carotid bruits  Regular rate and rhythm, no murmurs  Examination of peripheral vascular system by observation and palpation is normal  EYES:  Ophthalmoscopic exam of optic discs and posterior segments is UNREMARKABLE; no papilledema or hemorrhages  MUSCULOSKELETAL:  Gait, strength, tone, movements noted in Neurologic exam below  NEUROLOGIC: MENTAL STATUS:  No flowsheet data found.  awake, alert, oriented to person, place and time  recent and remote memory intact  normal attention and concentration  language fluent, comprehension intact, naming intact,   fund of knowledge appropriate  CRANIAL NERVE:   2nd - no papilledema on fundoscopic exam  2nd, 3rd, 4th, 6th - RIGHT RELATIVE AFFERENT PUPILLARY DEFECT; RIGHT PUPIL 4MM WITH SLUGGISH REACTION; LEFT PUPIL 4MM AND REACTS; visual fields full to confrontation IN LEFT EYE; RIGHT EYE CAN DETECT LIGHT AND SOME MOTION ONLY, extraocular muscles intact, no nystagmus  5th - facial sensation symmetric  7th - facial strength symmetric  8th - hearing intact  9th - palate elevates symmetrically, uvula midline  11th - shoulder shrug symmetric  12th - tongue protrusion midline  MOTOR:   normal bulk and tone, full strength in the BUE, BLE  SENSORY:   normal and symmetric to light touch, temperature, vibration  COORDINATION:   finger-nose-finger, fine finger movements normal  REFLEXES:   deep tendon reflexes TRACE and symmetric  GAIT/STATION:    narrow based gait    DIAGNOSTIC DATA (LABS, IMAGING, TESTING) - I reviewed patient records, labs, notes, testing and imaging myself where available.  Lab Results  Component Value Date   WBC 6.9 07/02/2017   HGB 14.6 07/02/2017   HCT 45.8 07/02/2017   MCV 93.3 07/02/2017   PLT 247 07/02/2017      Component Value Date/Time   NA 141 07/02/2017 0501   K 4.1 07/02/2017 0501   CL 106 07/02/2017 0501   CO2 27 07/02/2017 0501   GLUCOSE 106 (H) 07/02/2017 0501   BUN 20 07/02/2017 0501   CREATININE 1.02 07/02/2017 0501   CALCIUM 9.4 07/02/2017 0501   PROT 7.0 07/02/2017 0501   ALBUMIN 3.6 07/02/2017 0501   AST 15 07/02/2017 0501   ALT 16 (L) 07/02/2017 0501   ALKPHOS 46 07/02/2017 0501   BILITOT 0.7 07/02/2017 0501   GFRNONAA >60 07/02/2017 0501   GFRAA >60 07/02/2017 0501   No results found for: CHOL, HDL, LDLCALC, LDLDIRECT, TRIG, CHOLHDL No results found for: HGBA1C Lab Results  Component Value Date   VITAMINB12 375 07/02/2017   No results found for: TSH    07/02/17 MRI brain and orbits [I reviewed images myself and agree with interpretation. -VRP]  1. Asymmetric right perineural enhancement, suspect optic perineuritis in this setting. 2. 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     ASSESSMENT AND PLAN  72 y.o. 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  Will restart corticosteroid therapy, repeat lab testing, repeat imaging, and recommend follow-up with ophthalmology.   Dx: recurrent right optic perineuritis  1. Optic perineuritis   2. Vision loss of right eye      PLAN:  RIGHT OPTIC PERINEURITIS (  recurrent) - solumedrol '1000mg'$  IV x 3 days in office; then prednisone '80mg'$  daily x 2 weeks; reduce by '10mg'$  every 2 weeks - check CBC, CMP, ANCA, ANA, CRP, ESR, NMO ab - repeat MRI brain and orbits -  follow up with ophthalmology in 2-4 weeks - may consider MRI cervical, thoracic spine (eval for demyelinating disease) - may consider LP (OCBs, cell count, prot, glucose)  Orders Placed This Encounter  Procedures  . MR BRAIN W WO CONTRAST  . MR ORBITS W WO CONTRAST  . CBC with Differential/Platelet  . Comprehensive metabolic panel  . C-reactive Protein  . Sedimentation Rate  . Pan-ANCA  . ANA,IFA RA Diag Pnl w/rflx Tit/Patn  . Neuromyelitis optica autoab, IgG   Meds ordered this encounter  Medications  . predniSONE (DELTASONE) 10 MG tablet    Sig: Take daily as directed    Dispense:  240 tablet    Refill:  6   Return in about 6 weeks (around 10/11/2017).  I reviewed images, labs, notes, records myself. I summarized findings and reviewed with patient, for this high risk condition (right eye vision loss) requiring high complexity decision making.     Penni Bombard, MD 06/21/2255, 5:05 PM Certified in Neurology, Neurophysiology and Neuroimaging  [facility] [address], [place], [place] [address] [phone]

## 2017-08-30 NOTE — Telephone Encounter (Signed)
Medicare/BCBS fed order sent to GI. No auth they will reach out to the pt to schedule.  °

## 2017-08-30 NOTE — Patient Instructions (Addendum)
RIGHT OPTIC PERINEURITIS - start solumedrol 1000mg  IV x 3 days in office; then on Saturday, start prednisone 80mg  daily x 2 weeks; then reduce by 10mg  per day every 2 weeks (70mg  daily x 2 weeks, then 60mg  daily x 2 weeks .Marland KitchenMarland Kitchen)  - start calcium + vitamin D daily (over the counter)  - continue omeprazole daily  - check labs   - repeat MRI brain and orbits

## 2017-09-02 LAB — CBC WITH DIFFERENTIAL/PLATELET
Basophils Absolute: 0 10*3/uL (ref 0.0–0.2)
Basos: 0 %
EOS (ABSOLUTE): 0.1 10*3/uL (ref 0.0–0.4)
EOS: 2 %
HEMATOCRIT: 44.6 % (ref 37.5–51.0)
Hemoglobin: 14.6 g/dL (ref 13.0–17.7)
Immature Grans (Abs): 0.1 10*3/uL (ref 0.0–0.1)
Immature Granulocytes: 1 %
LYMPHS ABS: 1.7 10*3/uL (ref 0.7–3.1)
Lymphs: 18 %
MCH: 30.7 pg (ref 26.6–33.0)
MCHC: 32.7 g/dL (ref 31.5–35.7)
MCV: 94 fL (ref 79–97)
MONOS ABS: 0.8 10*3/uL (ref 0.1–0.9)
Monocytes: 9 %
NEUTROS ABS: 6.3 10*3/uL (ref 1.4–7.0)
Neutrophils: 70 %
Platelets: 204 10*3/uL (ref 150–450)
RBC: 4.75 x10E6/uL (ref 4.14–5.80)
RDW: 12.8 % (ref 12.3–15.4)
WBC: 9 10*3/uL (ref 3.4–10.8)

## 2017-09-02 LAB — SEDIMENTATION RATE: SED RATE: 5 mm/h (ref 0–30)

## 2017-09-02 LAB — COMPREHENSIVE METABOLIC PANEL WITH GFR
ALT: 17 IU/L (ref 0–44)
AST: 15 IU/L (ref 0–40)
Albumin/Globulin Ratio: 1.6 (ref 1.2–2.2)
Albumin: 4.1 g/dL (ref 3.5–4.8)
Alkaline Phosphatase: 45 IU/L (ref 39–117)
BUN/Creatinine Ratio: 13 (ref 10–24)
BUN: 11 mg/dL (ref 8–27)
Bilirubin Total: 0.4 mg/dL (ref 0.0–1.2)
CO2: 25 mmol/L (ref 20–29)
Calcium: 9.7 mg/dL (ref 8.6–10.2)
Chloride: 103 mmol/L (ref 96–106)
Creatinine, Ser: 0.88 mg/dL (ref 0.76–1.27)
GFR calc Af Amer: 99 mL/min/1.73 (ref >59)
GFR calc non Af Amer: 85 mL/min/1.73 (ref >59)
Globulin, Total: 2.6 g/dL (ref 1.5–4.5)
Glucose: 88 mg/dL (ref 65–99)
Potassium: 4.3 mmol/L (ref 3.5–5.2)
Sodium: 141 mmol/L (ref 134–144)
Total Protein: 6.7 g/dL (ref 6.0–8.5)

## 2017-09-02 LAB — C-REACTIVE PROTEIN: CRP: 5 mg/L (ref 0–10)

## 2017-09-02 LAB — ANA,IFA RA DIAG PNL W/RFLX TIT/PATN
ANA Titer 1: NEGATIVE
Cyclic Citrullin Peptide Ab: 3 U (ref 0–19)
Rheumatoid fact SerPl-aCnc: <10 [IU]/mL (ref 0.0–13.9)

## 2017-09-02 LAB — PAN-ANCA
ANCA Proteinase 3: <3.5 U/mL (ref 0.0–3.5)
C-ANCA: <1:20 {titer}
P-ANCA: <1:20 {titer}

## 2017-09-15 ENCOUNTER — Ambulatory Visit: Payer: Federal, State, Local not specified - PPO | Admitting: Diagnostic Neuroimaging

## 2017-09-15 ENCOUNTER — Encounter

## 2017-10-09 ENCOUNTER — Other Ambulatory Visit: Payer: Medicare Other

## 2017-10-10 ENCOUNTER — Ambulatory Visit: Payer: Self-pay | Admitting: Diagnostic Neuroimaging

## 2017-11-08 ENCOUNTER — Encounter: Payer: Self-pay | Admitting: Diagnostic Neuroimaging

## 2017-11-08 ENCOUNTER — Ambulatory Visit (INDEPENDENT_AMBULATORY_CARE_PROVIDER_SITE_OTHER): Payer: Medicare Other | Admitting: Diagnostic Neuroimaging

## 2017-11-08 VITALS — BP 148/91 | HR 91 | Ht 71.0 in | Wt 154.4 lb

## 2017-11-08 DIAGNOSIS — H469 Unspecified optic neuritis: Secondary | ICD-10-CM | POA: Diagnosis not present

## 2017-11-08 NOTE — Progress Notes (Signed)
[facility]  PATIENT: Brady Rodriguez DOB: 1945/02/28  REFERRING CLINICIAN: Antony Blackbird HISTORY FROM: patient  REASON FOR VISIT: follow up   HISTORICAL  CHIEF COMPLAINT:  Chief Complaint  Patient presents with  . Follow-up  . optic neuritis R eye    noted improvement in vision after 2nd dose of IV steroids.  Back to baseline now.     HISTORY OF PRESENT ILLNESS:   UPDATE (11/08/17, VRP): Since last visit, doing well. Symptoms are resolved. Completed prednisone. No alleviating or aggravating factors. Missed MRI appt, so did not reschedule. Has not been back to ophthalmology.    PRIOR HPI (08/30/17): 72 year old male here for evaluation of right optic perineuritis.  May 2019 patient had severe right eye pain, right-sided vision loss, headache, and was evaluated by ophthalmology.  Patient had MRI of the brain and orbits which showed possible right optic perineuritis and was admitted to the hospital.  Patient was treated with IV steroids for 5 days and discharged with prednisone taper.  On the last day of hospitalization patient's right eye vision symptoms had fully resolved.    Since that time patient is done well until 1 week ago when his right eye symptoms have returned.  He does not have significant pain compared to last time but he does have some slight discomfort and has had significant vision loss.  He is barely able to detect light and motion in the right eye.  Patient denies any problems with his left eye.  No numbness or tingling.  No weakness.  No headaches.  No slurred speech or trouble talking.  No gait or balance difficulty.  No vertigo.   REVIEW OF SYSTEMS: Full 14 system review of systems performed and negative with exception of: blurred vision --> resolved.    ALLERGIES: No Known Allergies  HOME MEDICATIONS: Outpatient Medications Prior to Visit  Medication Sig Dispense Refill  . amLODipine (NORVASC) 10 MG tablet Take 10 mg by mouth daily.    .  calcium carbonate (OS-CAL) 1250 (500 Ca) MG chewable tablet Chew 2 tablets (2,500 mg total) by mouth 2 (two) times daily. 60 tablet 0  . dorzolamide-timolol (COSOPT) 22.3-6.8 MG/ML ophthalmic solution Place 1 drop into the right eye daily.  0  . latanoprost (XALATAN) 0.005 % ophthalmic solution Place 1 drop into the right eye at bedtime.  0  . omeprazole (PRILOSEC) 20 MG capsule Take 20 mg by mouth daily.    . prazosin (MINIPRESS) 5 MG capsule Take 5-10 mg by mouth See admin instructions. Take 5 mg by mouth in the morning and take 10 mg by mouth at bedtime    . senna-docusate (COLACE 2-IN-1) 8.6-50 MG tablet Take 1 tablet by mouth daily.    . traZODone (DESYREL) 100 MG tablet Take 100 mg by mouth as needed.    Marland Kitchen PARoxetine (PAXIL) 40 MG tablet Take 40 mg by mouth every morning.    . predniSONE (DELTASONE) 10 MG tablet Take daily as directed 240 tablet 6   No facility-administered medications prior to visit.     PAST MEDICAL HISTORY: Past Medical History:  Diagnosis Date  . Anxiety   . Arthritis    "knees, back, shoulders" (07/03/2017)  . Depression   . Dyspnea    with exertion  . GERD (gastroesophageal reflux disease)   . Hypertension   . Personal history of colonic polyps 02/04/2002   hyperplastic  . Prostate cancer ([place])    S/P "8 weeks radiation end of last year" (  07/03/2017)  . Tobacco use disorder   . Urinary frequency   . Wears glasses     PAST SURGICAL HISTORY: Past Surgical History:  Procedure Laterality Date  . COLONOSCOPY W/ BIOPSIES AND POLYPECTOMY  2013  . EXCISIONAL HEMORRHOIDECTOMY    . INGUINAL HERNIA REPAIR Bilateral    x 2 2000, 2005  . MASS EXCISION N/A 12/31/2013   Procedure: EXCISION OF SCALP MASS;  Surgeon: Coralie Keens, MD;  Location: [facility];  Service: General;  Laterality: N/A;  . PARATHYROIDECTOMY N/A 12/28/2015   Procedure: PARATHYROIDECTOMY;  Surgeon: Coralie Keens, MD;  Location: [facility];  Service: General;  Laterality: N/A;    . TONSILLECTOMY      FAMILY HISTORY: Family History  Problem Relation Age of Onset  . Colon cancer Neg Hx   . Esophageal cancer Neg Hx   . Stomach cancer Neg Hx   . Cancer Neg Hx     SOCIAL HISTORY:  Social History   Socioeconomic History  . Marital status: Divorced    Spouse name: Not on file  . Number of children: 2  . Years of education: Not on file  . Highest education level: Not on file  Occupational History  . Occupation: retired    Fish farm manager: [employer]  Social Needs  . Financial resource strain: Not on file  . Food insecurity:    Worry: Not on file    Inability: Not on file  . Transportation needs:    Medical: Not on file    Non-medical: Not on file  Tobacco Use  . Smoking status: Current Every Day Smoker    Packs/day: 0.12    Years: 50.00    Pack years: 6.00    Types: Cigarettes  . Smokeless tobacco: Never Used  . Tobacco comment: "3-4 cigarettes a day"  Substance and Sexual Activity  . Alcohol use: Not Currently    Alcohol/week: 1.0 standard drinks    Types: 1 Cans of beer per week  . Drug use: No  . Sexual activity: Not Currently  Lifestyle  . Physical activity:    Days per week: Not on file    Minutes per session: Not on file  . Stress: Not on file  Relationships  . Social connections:    Talks on phone: Not on file    Gets together: Not on file    Attends religious service: Not on file    Active member of club or organization: Not on file    Attends meetings of clubs or organizations: Not on file    Relationship status: Not on file  . Intimate partner violence:    Fear of current or ex partner: Not on file    Emotionally abused: Not on file    Physically abused: Not on file    Forced sexual activity: Not on file  Other Topics Concern  . Not on file  Social History Narrative   Lives home alone.  Retired.  Is divorced.  Education : BS.  Children 2.  Andre-son on [employer].     PHYSICAL EXAM  GENERAL EXAM/CONSTITUTIONAL: Vitals:   Vitals:   11/08/17 1420  BP: (!) 148/91  Pulse: 91  Weight: 154 lb 6.4 oz (70 kg)  Height: 5\' 11"  (1.803 m)     Body mass index is 21.53 kg/m. Wt Readings from Last 3 Encounters:  11/08/17 154 lb 6.4 oz (70 kg)  08/30/17 160 lb 12.8 oz (72.9 kg)  07/02/17 160 lb (72.6 kg)  Patient is in no distress; well developed, nourished and groomed; neck is supple  CARDIOVASCULAR:  Examination of carotid arteries is normal; no carotid bruits  Regular rate and rhythm, no murmurs  Examination of peripheral vascular system by observation and palpation is normal  EYES:  Ophthalmoscopic exam of optic discs and posterior segments is normal; no papilledema or hemorrhages  Visual Acuity Screening   Right eye Left eye Both eyes  Without correction: 20/30 20/40   With correction:        MUSCULOSKELETAL:  Gait, strength, tone, movements noted in Neurologic exam below  NEUROLOGIC: MENTAL STATUS:  No flowsheet data found.  awake, alert, oriented to person, place and time  recent and remote memory intact  normal attention and concentration  language fluent, comprehension intact, naming intact  fund of knowledge appropriate  CRANIAL NERVE:   2nd - no papilledema on fundoscopic exam  2nd, 3rd, 4th, 6th - pupils equal and reactive to light, visual fields full to confrontation, extraocular muscles intact, no nystagmus  5th - facial sensation symmetric  7th - facial strength symmetric  8th - hearing intact  9th - palate elevates symmetrically, uvula midline  11th - shoulder shrug symmetric  12th - tongue protrusion midline  MOTOR:   normal bulk and tone, full strength in the BUE, BLE  SENSORY:   normal and symmetric to light touch, temperature, vibration  COORDINATION:   finger-nose-finger, fine finger movements normal  REFLEXES:   deep tendon reflexes present and symmetric  GAIT/STATION:   narrow based gait      DIAGNOSTIC DATA (LABS, IMAGING,  TESTING) - I reviewed patient records, labs, notes, testing and imaging myself where available.  Lab Results  Component Value Date   WBC 9.0 08/30/2017   HGB 14.6 08/30/2017   HCT 44.6 08/30/2017   MCV 94 08/30/2017   PLT 204 08/30/2017      Component Value Date/Time   NA 141 08/30/2017 1400   K 4.3 08/30/2017 1400   CL 103 08/30/2017 1400   CO2 25 08/30/2017 1400   GLUCOSE 88 08/30/2017 1400   GLUCOSE 106 (H) 07/02/2017 0501   BUN 11 08/30/2017 1400   CREATININE 0.88 08/30/2017 1400   CALCIUM 9.7 08/30/2017 1400   PROT 6.7 08/30/2017 1400   ALBUMIN 4.1 08/30/2017 1400   AST 15 08/30/2017 1400   ALT 17 08/30/2017 1400   ALKPHOS 45 08/30/2017 1400   BILITOT 0.4 08/30/2017 1400   GFRNONAA 85 08/30/2017 1400   GFRAA 99 08/30/2017 1400   No results found for: CHOL, HDL, LDLCALC, LDLDIRECT, TRIG, CHOLHDL No results found for: HGBA1C Lab Results  Component Value Date   VITAMINB12 375 07/02/2017   No results found for: TSH    07/02/17 MRI brain and orbits [I reviewed images myself and agree with interpretation. -VRP]  1. Asymmetric right perineural enhancement, suspect optic perineuritis in this setting. 2. 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     ASSESSMENT AND PLAN  72 y.o. 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  Now doing better.  Dx: recurrent right optic perineuritis  1. Optic perineuritis      PLAN:  RIGHT OPTIC PERINEURITIS (recurrent; May 2019 and July 2019) - follow up with ophthalmology - monitor sxs for recurrence; in future, may consider MRI cervical, thoracic spine, LP (OCBs, cell count, prot, glucose) -->  eval for demyelinating disease  Return if symptoms worsen or fail to improve.    Penni Bombard, MD 1/58/0638, 6:85 PM Certified in Neurology, Neurophysiology and  Neuroimaging  [facility] [address] [place], [place] [address] [phone]

## 2017-11-28 ENCOUNTER — Ambulatory Visit
Admission: RE | Admit: 2017-11-28 | Discharge: 2017-11-28 | Disposition: A | Discharge status: Home / Self Care | Payer: Medicare Other | Source: Ambulatory Visit | Attending: Diagnostic Neuroimaging | Admitting: Diagnostic Neuroimaging

## 2017-11-28 DIAGNOSIS — H5461 Unqualified visual loss, right eye, normal vision left eye: Secondary | ICD-10-CM

## 2017-11-28 DIAGNOSIS — H469 Unspecified optic neuritis: Secondary | ICD-10-CM

## 2017-11-28 MED ORDER — GADOBENATE DIMEGLUMINE 529 MG/ML IV SOLN
15.0000 mL | Freq: Once | INTRAVENOUS | Status: AC | PRN
  Administered 2017-11-28: 15 mL via INTRAVENOUS

## 2017-12-06 ENCOUNTER — Telehealth: Payer: Self-pay | Admitting: *Deleted

## 2017-12-06 NOTE — Telephone Encounter (Signed)
Spoke with patient and informed his MRI brain showed unremarkable imaging results, and his MR orbits showed a normal right optic nerve. Reviewed Dr Gladstone Lighter plan with him. He stated he doesn't have an eye dr. This RN advised he should probably get his vision checked at least yearly; he agreed. The patient verbalized understanding, then he expressed great appreciation for the service he received from everyone in this office.

## 2019-06-23 IMAGING — MR MR ORBITS WO/W CM
14 of 21 series · 27 of 48 positions shown · IV contrast (multihance)
Comparison: None.

CLINICAL DATA: Headache, acute, severe. Right eye pain with vision
loss on [REDACTED].

EXAM:
MRI HEAD AND ORBITS WITHOUT AND WITH CONTRAST
TECHNIQUE: Multiplanar, multiecho pulse sequences of the brain and surrounding
structures were obtained without and with intravenous contrast.
Multiplanar, multiecho pulse sequences of the orbits and surrounding
structures were obtained including fat saturation techniques, before
and after intravenous contrast administration.
CONTRAST:  15mL MULTIHANCE GADOBENATE DIMEGLUMINE 529 MG/ML IV SOLN

[Series 7001: T1 · sagittal · 5.0mm · 0.72mm/px · 1 of 25 slices shown (1 of 3)]
[im 1/25]
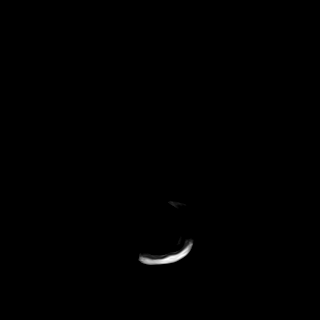

[Series 8001: T2 · axial · 4.0mm · 0.72mm/px · 1 of 32 slices shown]
[im 1/32]
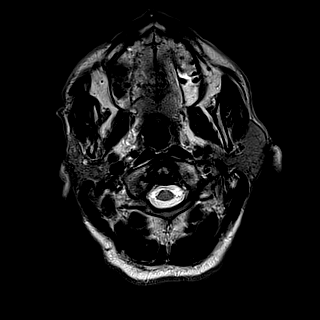

[FLAIR · axial · 3.0mm · 0.45mm/px · z∈[-56,+81]mm · 3 of 40 slices shown]
[im 1/40]
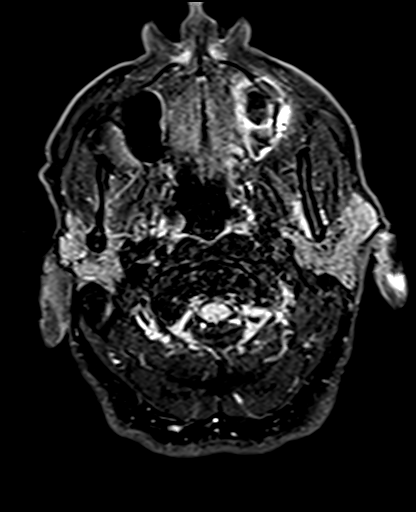
[im 20/40]
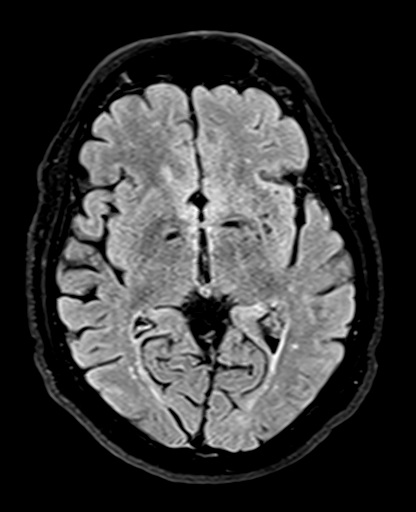
[im 40/40]
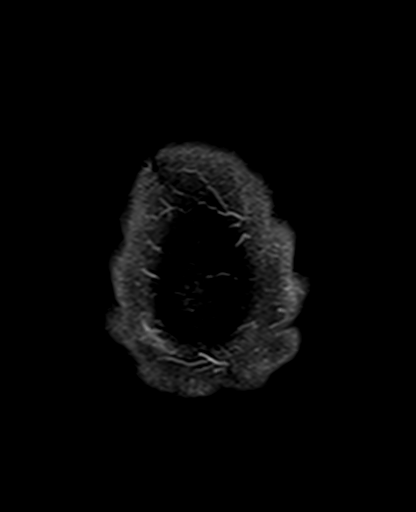

[T1 · axial · non-contrast · 3.0mm · 0.37mm/px · z∈[-59,+33]mm · 2 of 25 slices shown (2 of 3)]
[im 1/25]
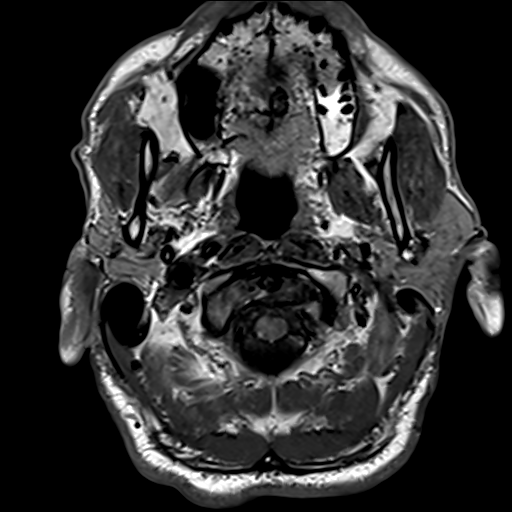
[im 25/25]
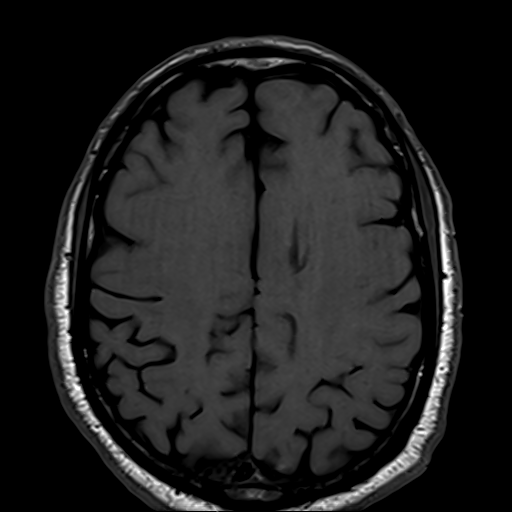

[T2 fat-sat · axial · 3.0mm · 0.54mm/px · z∈[-59,+33]mm · 2 of 25 slices shown (1 of 6)]
[im 1/25]
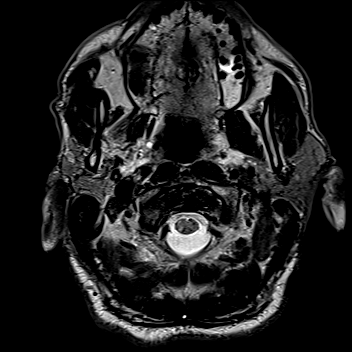
[im 25/25]
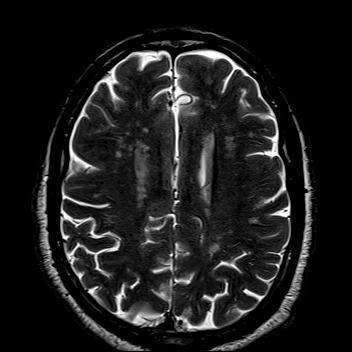

[T2 fat-sat · axial · 3.0mm · 0.54mm/px · z∈[-59,+33]mm · 2 of 25 slices shown (2 of 6)]
[im 1/25]
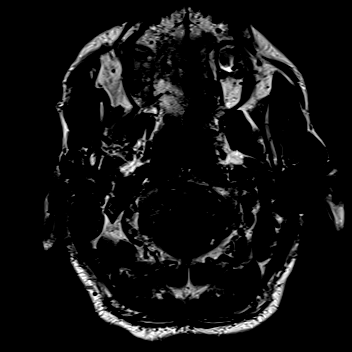
[im 25/25]
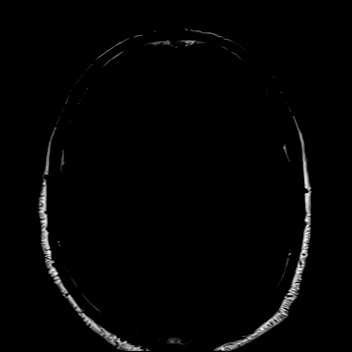

[T2 fat-sat · axial · 3.0mm · 0.54mm/px · z∈[-59,+33]mm · 2 of 25 slices shown (3 of 6)]
[im 1/25]
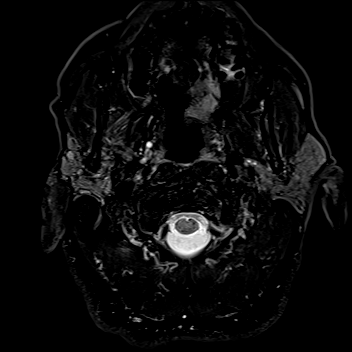
[im 25/25]
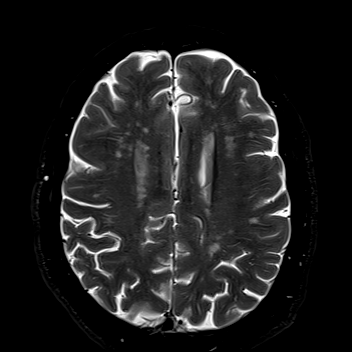

[T2 fat-sat · coronal · 3.0mm · 0.54mm/px · 2 of 32 slices shown (4 of 6)]
[im 1/32]
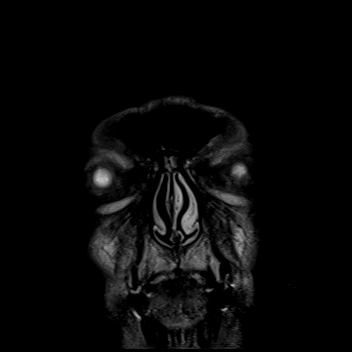
[im 32/32]
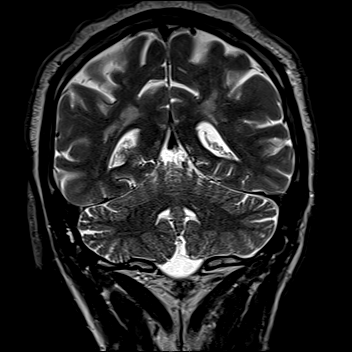

[T2 fat-sat · coronal · 3.0mm · 0.54mm/px · 2 of 32 slices shown (5 of 6)]
[im 1/32]
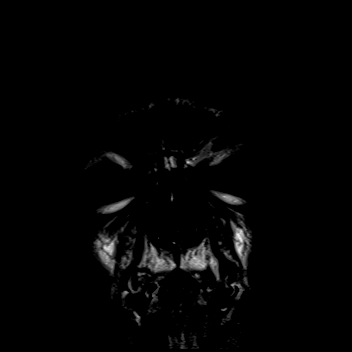
[im 32/32]
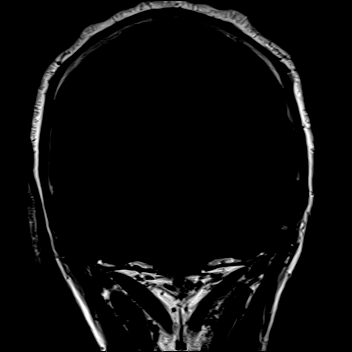

[T2 fat-sat · coronal · 3.0mm · 0.54mm/px · 2 of 32 slices shown (6 of 6)]
[im 1/32]
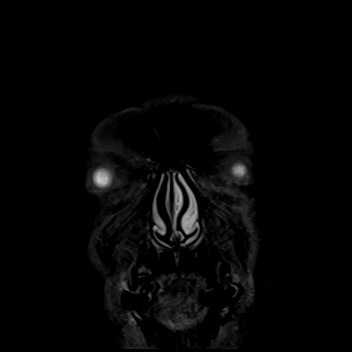
[im 32/32]
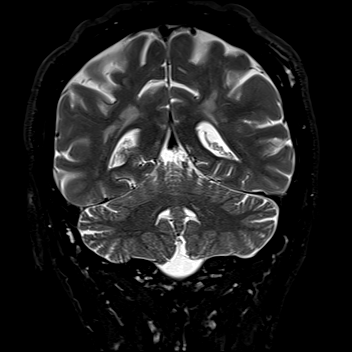

[T1 · coronal · 3.0mm · 0.37mm/px · 2 of 32 slices shown (3 of 3)]
[im 1/32]
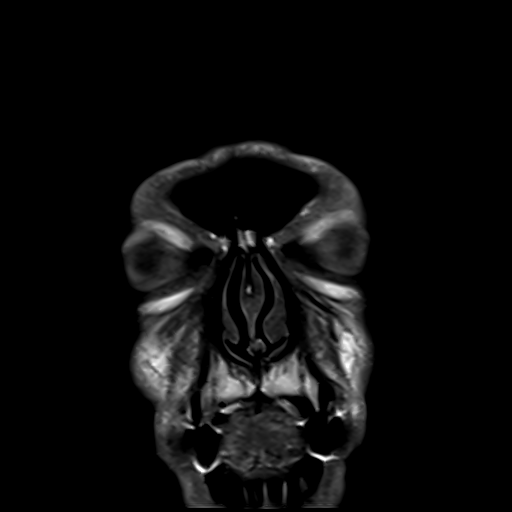
[im 32/32]
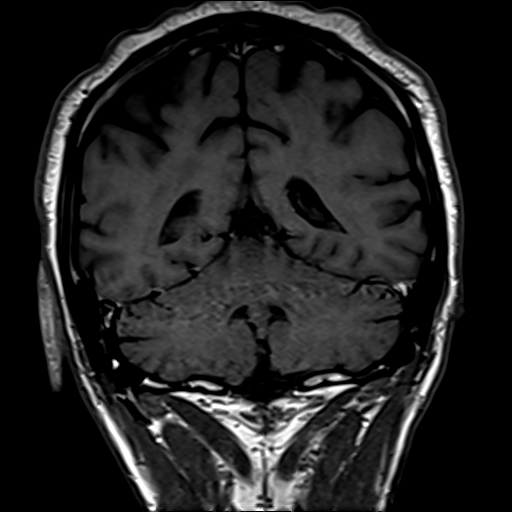

[T2 post-contrast · coronal · 5.0mm · 0.72mm/px · 2 of 28 slices shown]
[im 1/28]
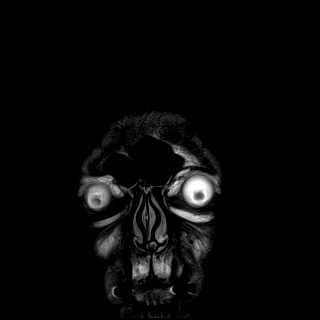
[im 28/28]
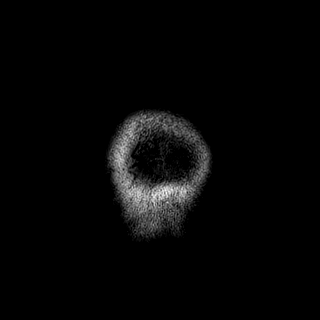

[T1 fat-sat post-contrast · axial · 3.0mm · 0.37mm/px · z∈[-59,+33]mm · 2 of 25 slices shown]
[im 1/25]
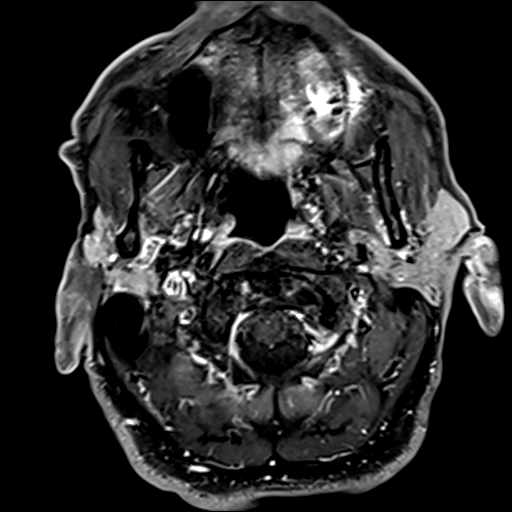
[im 25/25]
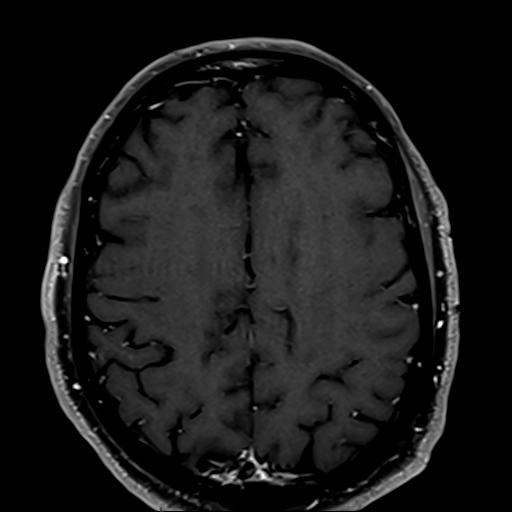

[T1 post-contrast · coronal · 5.0mm · 0.34mm/px · 2 of 28 slices shown]
[im 1/28]
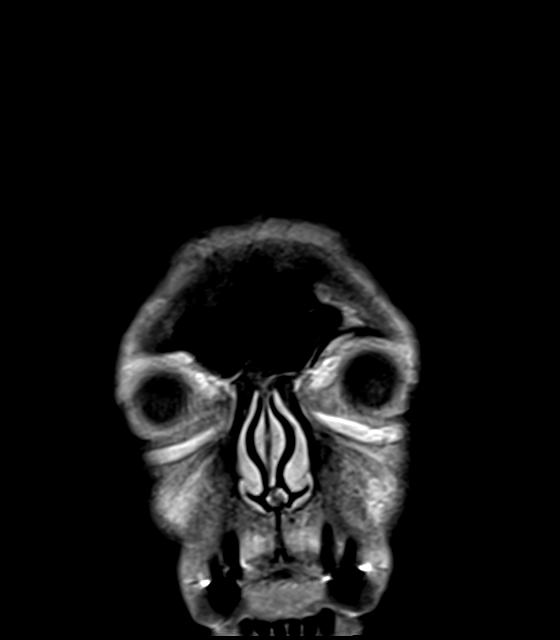
[im 28/28]
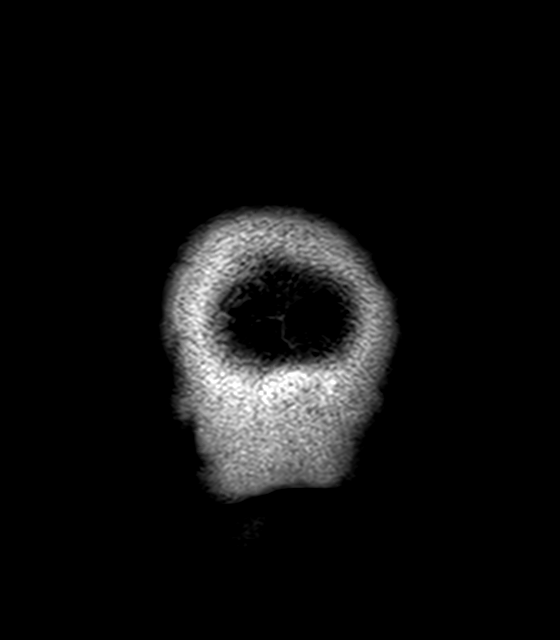

[27 of 48 positions shown; findings below may reference images not displayed]

FINDINGS: MRI HEAD FINDINGS

Brain: No acute infarction, hemorrhage, hydrocephalus, extra-axial
collection or mass lesion. Mild FLAIR hyperintensity in the cerebral
white matter, nonspecific but usually from chronic small vessel
disease. Age normal brain volume

Vascular: Major flow voids and vascular enhancements are preserved.

Skull and upper cervical spine: No evidence of marrow lesion.
Subgaleal lipoma along the high frontal scalp measuring 20 x 6 mm.

MRI ORBITS FINDINGS

Orbits: Asymmetric enhancement around the right optic nerve sheath,
see representative image [id number]. Enhancement is wispy and more
typical of inflammation than meningioma. This entity also correlates
with patient's eye pain. Symmetric normal appearance of the globes,
extraocular muscles, lacrimal glands, and superior ophthalmic veins.

Temporal arteritis usually has no MRI manifestations. Noted ESR and
CRP in the chart.

Visualized sinuses: Clear

Soft tissues: Negative
IMPRESSION: 1. Asymmetric right perineural enhancement, suspect optic
perineuritis in this setting.
2. Mild chronic small vessel disease in the cerebral white matter.

## 2019-07-24 ENCOUNTER — Encounter: Payer: Self-pay | Admitting: Neurology

## 2019-10-14 NOTE — Progress Notes (Deleted)
NEUROLOGY CONSULTATION NOTE  ALI MOHL MRN: [id number] DOB: 03-05-1945  Referring provider: Bartholome Bill, MD Primary care provider: Bartholome Bill, MD  Reason for consult:  Optic neuritis  HISTORY OF PRESENT ILLNESS: Brady Rodriguez is a 74 year old ***-handed male who presents for recurrent right optic perineuritis.  History supplemented by hospital, prior neurology and referring provider's note.    In May 2019, he developed severe right ocular pain with right-sided vision loss and headache.  MRI of brain and orbits with and without contrast showed possible right optic perineuritis.  He was admitted to the hospital where he was treated with 5 day course of IV steroids. On last day of hospitalization, vision returned and he was discharged on a prednisone taper.  In September 2019, at the end of his steroid taper, he began having recurrence of right eye vision loss with some eye discomfort but pain not as severe as in May.  He followed up with neurology.  MRI of brain and orbits from October 2019 showed mild chronic small vessel disease and resolution of prior right optic perineural enhancement.  Annual follow up exam with ophthalmology was recommended.  He has remained on chronic prednisone.  Every time he tries to taper off, he has recurrence of pain and vision loss in his right eye when he reaches 10mg .  Symptoms are stable when maintained on 20 to 40mg  daily.  ***  Labs 06/2017:  ANA negative; sed rate 1; CRP < 0.8; ACE 69; ENA panel negative; anti-Jo-1 antibody negative; RF negative; RPR nonreactive; B12 375; glucose 118 08/2017:  ANA negative; RF negative; cyclic citrullin peptide ab negative; sed rate 5; CRP 5; pan-ANCA negative;   PAST MEDICAL HISTORY: Past Medical History:  Diagnosis Date  . Anxiety   . Arthritis    "knees, back, shoulders" (07/03/2017)  . Depression   . Dyspnea    with exertion  . GERD (gastroesophageal reflux disease)   . Hypertension     . Personal history of colonic polyps 02/04/2002   hyperplastic  . Prostate cancer ([place])    S/P "8 weeks radiation end of last year" (07/03/2017)  . Tobacco use disorder   . Urinary frequency   . Wears glasses     PAST SURGICAL HISTORY: Past Surgical History:  Procedure Laterality Date  . COLONOSCOPY W/ BIOPSIES AND POLYPECTOMY  2013  . EXCISIONAL HEMORRHOIDECTOMY    . INGUINAL HERNIA REPAIR Bilateral    x 2 2000, 2005  . MASS EXCISION N/A 12/31/2013   Procedure: EXCISION OF SCALP MASS;  Surgeon: Coralie Keens, MD;  Location: [facility];  Service: General;  Laterality: N/A;  . PARATHYROIDECTOMY N/A 12/28/2015   Procedure: PARATHYROIDECTOMY;  Surgeon: Coralie Keens, MD;  Location: [facility];  Service: General;  Laterality: N/A;  . TONSILLECTOMY      MEDICATIONS: Current Outpatient Medications on File Prior to Visit  Medication Sig Dispense Refill  . amLODipine (NORVASC) 10 MG tablet Take 10 mg by mouth daily.    . calcium carbonate (OS-CAL) 1250 (500 Ca) MG chewable tablet Chew 2 tablets (2,500 mg total) by mouth 2 (two) times daily. 60 tablet 0  . dorzolamide-timolol (COSOPT) 22.3-6.8 MG/ML ophthalmic solution Place 1 drop into the right eye daily.  0  . latanoprost (XALATAN) 0.005 % ophthalmic solution Place 1 drop into the right eye at bedtime.  0  . omeprazole (PRILOSEC) 20 MG capsule Take 20 mg by mouth daily.    . prazosin (MINIPRESS)  5 MG capsule Take 5-10 mg by mouth See admin instructions. Take 5 mg by mouth in the morning and take 10 mg by mouth at bedtime    . senna-docusate (COLACE 2-IN-1) 8.6-50 MG tablet Take 1 tablet by mouth daily.    . traZODone (DESYREL) 100 MG tablet Take 100 mg by mouth as needed.     No current facility-administered medications on file prior to visit.    ALLERGIES: No Known Allergies  FAMILY HISTORY: Family History  Problem Relation Age of Onset  . Colon cancer Neg Hx   . Esophageal cancer Neg Hx   . Stomach cancer  Neg Hx   . Cancer Neg Hx    ***.  SOCIAL HISTORY: Social History   Socioeconomic History  . Marital status: Divorced    Spouse name: Not on file  . Number of children: 2  . Years of education: Not on file  . Highest education level: Not on file  Occupational History  . Occupation: retired    Fish farm manager: [employer]  Tobacco Use  . Smoking status: Current Every Day Smoker    Packs/day: 0.12    Years: 50.00    Pack years: 6.00    Types: Cigarettes  . Smokeless tobacco: Never Used  . Tobacco comment: "3-4 cigarettes a day"  Vaping Use  . Vaping Use: Never used  Substance and Sexual Activity  . Alcohol use: Not Currently    Alcohol/week: 1.0 standard drink    Types: 1 Cans of beer per week  . Drug use: No  . Sexual activity: Not Currently  Other Topics Concern  . Not on file  Social History Narrative   Lives home alone.  Retired.  Is divorced.  Education : BS.  Children 2.  Andre-son on DPR.   Social Determinants of Health   Financial Resource Strain:   . Difficulty of Paying Living Expenses: Not on file  Food Insecurity:   . Worried About Charity fundraiser in the Last Year: Not on file  . Ran Out of Food in the Last Year: Not on file  Transportation Needs:   . Lack of Transportation (Medical): Not on file  . Lack of Transportation (Non-Medical): Not on file  Physical Activity:   . Days of Exercise per Week: Not on file  . Minutes of Exercise per Session: Not on file  Stress:   . Feeling of Stress : Not on file  Social Connections:   . Frequency of Communication with Friends and Family: Not on file  . Frequency of Social Gatherings with Friends and Family: Not on file  . Attends Religious Services: Not on file  . Active Member of Clubs or Organizations: Not on file  . Attends Archivist Meetings: Not on file  . Marital Status: Not on file  Intimate Partner Violence:   . Fear of Current or Ex-Partner: Not on file  . Emotionally Abused: Not on file   . Physically Abused: Not on file  . Sexually Abused: Not on file    REVIEW OF SYSTEMS: Constitutional: No fevers, chills, or sweats, no generalized fatigue, change in appetite Eyes: No visual changes, double vision, eye pain Ear, nose and throat: No hearing loss, ear pain, nasal congestion, sore throat Cardiovascular: No chest pain, palpitations Respiratory:  No shortness of breath at rest or with exertion, wheezes GastrointestinaI: No nausea, vomiting, diarrhea, abdominal pain, fecal incontinence Genitourinary:  No dysuria, urinary retention or frequency Musculoskeletal:  No neck pain, back  pain Integumentary: No rash, pruritus, skin lesions Neurological: as above Psychiatric: No depression, insomnia, anxiety Endocrine: No palpitations, fatigue, diaphoresis, mood swings, change in appetite, change in weight, increased thirst Hematologic/Lymphatic:  No purpura, petechiae. Allergic/Immunologic: no itchy/runny eyes, nasal congestion, recent allergic reactions, rashes  PHYSICAL EXAM: *** General: No acute distress.  Patient appears ***-groomed.  *** Head:  Normocephalic/atraumatic Eyes:  fundi examined but not visualized Neck: supple, no paraspinal tenderness, full range of motion Back: No paraspinal tenderness Heart: regular rate and rhythm Lungs: Clear to auscultation bilaterally. Vascular: No carotid bruits. Neurological Exam: Mental status: alert and oriented to person, place, and time, recent and remote memory intact, fund of knowledge intact, attention and concentration intact, speech fluent and not dysarthric, language intact. Cranial nerves: CN I: not tested CN II: pupils equal, round and reactive to light, visual fields intact CN III, IV, VI:  full range of motion, no nystagmus, no ptosis CN V: facial sensation intact CN VII: upper and lower face symmetric CN VIII: hearing intact CN IX, X: gag intact, uvula midline CN XI: sternocleidomastoid and trapezius muscles  intact CN XII: tongue midline Bulk & Tone: normal, no fasciculations. Motor:  5/5 throughout *** Sensation:  Pinprick *** temperature *** and vibration sensation intact.  ***. Deep Tendon Reflexes:  2+ throughout, *** toes downgoing.  *** Finger to nose testing:  Without dysmetria.  *** Heel to shin:  Without dysmetria.  *** Gait:  Normal station and stride.  Able to turn and tandem walk. Romberg ***.  IMPRESSION: ***  PLAN: ***  Thank you for allowing me to take part in the care of this patient.  Metta Clines, DO  CC: ***

## 2019-10-15 ENCOUNTER — Ambulatory Visit: Payer: Medicare Other | Admitting: Neurology

## 2019-10-15 ENCOUNTER — Other Ambulatory Visit: Payer: Self-pay

## 2019-12-29 NOTE — Progress Notes (Signed)
NEUROLOGY CONSULTATION NOTE  JAYDYN BOZZO MRN: [id number] DOB: 03/22/1945  Referring provider: Bartholome Bill, MD Primary care provider: Bartholome Bill, MD  Reason for consult:  Optic neuritis.   Subjective:  Brady Rodriguez is a 74 year old right-handed male who presents for right optic perineuritis.  History supplemented by hospital, neurologist's and referring provider's notes.  On 07/02/2017 he was admitted to [facility] with vision loss in the right eye and 5 days of headache.  ESR (1) and CRP (<0.8) were normal.  ANA negative, ACE 69, RPR nonreactive, SSA/SSB antibodies negative, Anti-Jo 1 antibody IgG negative, RF negative and B12 375.  MRI of brain and orbits with and without contrast personally reviewed showed right apical perineuritis.  He was treated with 5 day course of Solu-Medrol followed by prolonged prednisone taper.  Vision fully resolved.  That July, right eye symptoms returned, unable to detect light or motion in the right eye.  No associated headache.  He followed up with outpatient neurology where he received a 3 day course of Solu-Medrol followed by a prednisone taper.  Symptoms again resolved.  Autoimmune workup was negative, including negative ANA, sed rate 5, CRP 5, and negative ANCA panel.  MRI of brain and orbits with and without contrast on 11/29/2017 personally reviewed were unremarkable with mild chronic small vessel disease.  He had a recurrent event in December 2019 in which he was hospitalized at the [place] in [place], where he underwent an LP with reportedly normal CSF analysis and he was again given IV steroids and discharged on prednisone taper.  However, when he had another episode in March 2020 in [place], [place], he was maintained on prednisone $RemoveBefor'20mg'EDLIqoBeeuHX$  daily and has tried tapering down, now on $Remo'10mg'aKTBT$  daily.  When he tried going down to 7.$RemoveB'5mg'gBewzmaX$  daily, he developed eye pain but no change in vision.  He wishes to try and come off of the prednisone.  He did  not tolerate GI side effects of methotrexate.  He followed up with neuro-ophthalmology at [facility] where they are again attempting a slow taper of the prednisone.  Eye exam in September revealed [facility] 20/25 OD and 20/30 OS, IOP 17 OD and 15 OS, no APD, HVF normal, color vision 9.5/10 OU, normal optic nerves and optic discs and RNFL full but asymmetric (91 OD, 108 OS). Currently on $RemoveBefo'5mg'phdGqhPECOG$  daily  PAST MEDICAL HISTORY:   Past Medical History:  Diagnosis Date  . Anxiety   . Arthritis    "knees, back, shoulders" (07/03/2017)  . Depression   . Dyspnea    with exertion  . GERD (gastroesophageal reflux disease)   . Hypertension   . Personal history of colonic polyps 02/04/2002   hyperplastic  . Prostate cancer ([place])    S/P "8 weeks radiation end of last year" (07/03/2017)  . Tobacco use disorder   . Urinary frequency   . Wears glasses     PAST SURGICAL HISTORY: Past Surgical History:  Procedure Laterality Date  . COLONOSCOPY W/ BIOPSIES AND POLYPECTOMY  2013  . EXCISIONAL HEMORRHOIDECTOMY    . INGUINAL HERNIA REPAIR Bilateral    x 2 2000, 2005  . MASS EXCISION N/A 12/31/2013   Procedure: EXCISION OF SCALP MASS;  Surgeon: Coralie Keens, MD;  Location: [facility];  Service: General;  Laterality: N/A;  . PARATHYROIDECTOMY N/A 12/28/2015   Procedure: PARATHYROIDECTOMY;  Surgeon: Coralie Keens, MD;  Location: [facility];  Service: General;  Laterality: N/A;  . TONSILLECTOMY  MEDICATIONS: Current Outpatient Medications on File Prior to Visit  Medication Sig Dispense Refill  . amLODipine (NORVASC) 10 MG tablet Take 10 mg by mouth daily.    . calcium carbonate (OS-CAL) 1250 (500 Ca) MG chewable tablet Chew 2 tablets (2,500 mg total) by mouth 2 (two) times daily. 60 tablet 0  . dorzolamide-timolol (COSOPT) 22.3-6.8 MG/ML ophthalmic solution Place 1 drop into the right eye daily.  0  . latanoprost (XALATAN) 0.005 % ophthalmic solution Place 1 drop into the right eye at bedtime.  0    . omeprazole (PRILOSEC) 20 MG capsule Take 20 mg by mouth daily.    . prazosin (MINIPRESS) 5 MG capsule Take 5-10 mg by mouth See admin instructions. Take 5 mg by mouth in the morning and take 10 mg by mouth at bedtime    . senna-docusate (COLACE 2-IN-1) 8.6-50 MG tablet Take 1 tablet by mouth daily.    . traZODone (DESYREL) 100 MG tablet Take 100 mg by mouth as needed.     No current facility-administered medications on file prior to visit.    ALLERGIES: No Known Allergies  FAMILY HISTORY: Family History  Problem Relation Age of Onset  . Colon cancer Neg Hx   . Esophageal cancer Neg Hx   . Stomach cancer Neg Hx   . Cancer Neg Hx    SOCIAL HISTORY: Social History   Socioeconomic History  . Marital status: Divorced    Spouse name: Not on file  . Number of children: 2  . Years of education: Not on file  . Highest education level: Not on file  Occupational History  . Occupation: retired    Fish farm manager: [employer]  Tobacco Use  . Smoking status: Current Every Day Smoker    Packs/day: 0.12    Years: 50.00    Pack years: 6.00    Types: Cigarettes  . Smokeless tobacco: Never Used  . Tobacco comment: "3-4 cigarettes a day"  Vaping Use  . Vaping Use: Never used  Substance and Sexual Activity  . Alcohol use: Not Currently    Alcohol/week: 1.0 standard drink    Types: 1 Cans of beer per week  . Drug use: No  . Sexual activity: Not Currently  Other Topics Concern  . Not on file  Social History Narrative   Lives home alone.  Retired.  Is divorced.  Education : BS.  Children 2.  Andre-son on DPR.   Social Determinants of Health   Financial Resource Strain:   . Difficulty of Paying Living Expenses: Not on file  Food Insecurity:   . Worried About Charity fundraiser in the Last Year: Not on file  . Ran Out of Food in the Last Year: Not on file  Transportation Needs:   . Lack of Transportation (Medical): Not on file  . Lack of Transportation (Non-Medical): Not on file   Physical Activity:   . Days of Exercise per Week: Not on file  . Minutes of Exercise per Session: Not on file  Stress:   . Feeling of Stress : Not on file  Social Connections:   . Frequency of Communication with Friends and Family: Not on file  . Frequency of Social Gatherings with Friends and Family: Not on file  . Attends Religious Services: Not on file  . Active Member of Clubs or Organizations: Not on file  . Attends Archivist Meetings: Not on file  . Marital Status: Not on file  Intimate Partner Violence:   .  Fear of Current or Ex-Partner: Not on file  . Emotionally Abused: Not on file  . Physically Abused: Not on file  . Sexually Abused: Not on file    Objective:  Blood pressure (!) 152/94, pulse (!) 115, height $RemoveBef'5\' 11"'KRsjMVEEfQ$  (1.803 m), weight 152 lb 9.6 oz (69.2 kg), SpO2 98 %. General: No acute distress.  Patient appears well-groomed.   Head:  Normocephalic/atraumatic Eyes:  fundi examined but not visualized Neck: supple, no paraspinal tenderness, full range of motion Back: No paraspinal tenderness Heart: regular rate and rhythm Lungs: Clear to auscultation bilaterally. Vascular: No carotid bruits. Neurological Exam: Mental status: alert and oriented to person, place, and time, recent and remote memory intact, fund of knowledge intact, attention and concentration intact, speech fluent and not dysarthric, language intact. Cranial nerves: CN I: not tested CN II: pupils equal, round and reactive to light, visual fields intact CN III, IV, VI:  full range of motion, no nystagmus, no ptosis CN V: facial sensation intact. CN VII: upper and lower face symmetric CN VIII: hearing intact CN IX, X: gag intact, uvula midline CN XI: sternocleidomastoid and trapezius muscles intact CN XII: tongue midline Bulk & Tone: normal, no fasciculations. Motor:  muscle strength 5/5 throughout Sensation:  Pinprick, temperature and vibratory sensation intact. Deep Tendon Reflexes:  2+  throughout,  toes downgoing.   Finger to nose testing:  Without dysmetria.   Heel to shin:  Without dysmetria.   Gait:  Normal station and stride.  Romberg negative.  Assessment/Plan:   Recurrent right optic perineuritis  1.  Treated at [facility] 2.  Will continue workup:  -  NMO antibodies  -  MRI of cervical spine with and without contrast 3.  Follow up in 6 months.    Thank you for allowing me to take part in the care of this patient.  Metta Clines, DO  CC: Tammy Arn Medal, MD

## 2019-12-30 ENCOUNTER — Other Ambulatory Visit: Payer: Self-pay

## 2019-12-30 ENCOUNTER — Other Ambulatory Visit (INDEPENDENT_AMBULATORY_CARE_PROVIDER_SITE_OTHER): Payer: Medicare Other

## 2019-12-30 ENCOUNTER — Ambulatory Visit (INDEPENDENT_AMBULATORY_CARE_PROVIDER_SITE_OTHER): Payer: Medicare Other | Admitting: Neurology

## 2019-12-30 ENCOUNTER — Encounter: Payer: Self-pay | Admitting: Neurology

## 2019-12-30 VITALS — BP 152/94 | HR 115 | Ht 71.0 in | Wt 152.6 lb

## 2019-12-30 DIAGNOSIS — H469 Unspecified optic neuritis: Secondary | ICD-10-CM

## 2019-12-30 NOTE — Patient Instructions (Addendum)
1.  Will check MRI of cervical spine with and without contrast. We have sent a referral to [facility] for your MRI and they will call you directly to schedule your appointment. They are located at [place]. If you need to contact them directly please call [phone].  2.  Check NMO antibody 3.  Follow up 6 months.

## 2020-01-01 LAB — NEUROMYELITIS OPTICA AUTOAB, IGG: NMO IgG Autoantibodies: <1.5 U/mL (ref 0.0–3.0)

## 2020-01-22 ENCOUNTER — Telehealth: Payer: Self-pay | Admitting: Neurology

## 2020-01-22 NOTE — Telephone Encounter (Signed)
[facility] called and did not get the office notes from the referral they sent [place] for the 12/30/19 visit. Please fax them to [phone].

## 2020-01-22 NOTE — Telephone Encounter (Signed)
Refaxed at 1:04pm 01/22/2020.

## 2020-01-29 ENCOUNTER — Ambulatory Visit
Admission: RE | Admit: 2020-01-29 | Discharge: 2020-01-29 | Disposition: A | Discharge status: Home / Self Care | Payer: Medicare Other | Source: Ambulatory Visit | Attending: Neurology | Admitting: Neurology

## 2020-01-29 DIAGNOSIS — H469 Unspecified optic neuritis: Secondary | ICD-10-CM

## 2020-01-29 MED ORDER — GADOBENATE DIMEGLUMINE 529 MG/ML IV SOLN
15.0000 mL | Freq: Once | INTRAVENOUS | Status: AC | PRN
  Administered 2020-01-29: 15 mL via INTRAVENOUS

## 2020-05-25 ENCOUNTER — Other Ambulatory Visit: Payer: Self-pay

## 2020-06-08 ENCOUNTER — Ambulatory Visit: Payer: Medicare Other | Admitting: Nurse Practitioner

## 2020-06-29 ENCOUNTER — Ambulatory Visit: Payer: Medicare Other | Admitting: Neurology
